# Patient Record
Sex: Male | Born: 1970 | Race: White | Hispanic: No | Marital: Married | Smoking: Former smoker
Health system: Southern US, Community
[De-identification: ages and names within clinical notes are randomized; demographics above are authoritative.]

## PROBLEM LIST (undated history)

## (undated) DIAGNOSIS — N200 Calculus of kidney: Secondary | ICD-10-CM

## (undated) DIAGNOSIS — J309 Allergic rhinitis, unspecified: Secondary | ICD-10-CM

## (undated) DIAGNOSIS — L409 Psoriasis, unspecified: Secondary | ICD-10-CM

## (undated) DIAGNOSIS — Z86010 Personal history of colonic polyps: Secondary | ICD-10-CM

## (undated) DIAGNOSIS — K219 Gastro-esophageal reflux disease without esophagitis: Secondary | ICD-10-CM

## (undated) DIAGNOSIS — E785 Hyperlipidemia, unspecified: Secondary | ICD-10-CM

## (undated) DIAGNOSIS — R3915 Urgency of urination: Secondary | ICD-10-CM

## (undated) HISTORY — DX: Gastro-esophageal reflux disease without esophagitis: K21.9

## (undated) HISTORY — DX: Calculus of kidney: N20.0

## (undated) HISTORY — DX: Hyperlipidemia, unspecified: E78.5

## (undated) HISTORY — DX: Personal history of colonic polyps: Z86.010

## (undated) HISTORY — PX: COLONOSCOPY: SHX174

## (undated) HISTORY — DX: Allergic rhinitis, unspecified: J30.9

## (undated) HISTORY — PX: KIDNEY STONE SURGERY: SHX686

## (undated) HISTORY — DX: Psoriasis, unspecified: L40.9

## (undated) HISTORY — DX: Urgency of urination: R39.15

---

## 1998-04-28 ENCOUNTER — Encounter: Payer: Self-pay | Admitting: Emergency Medicine

## 1998-04-28 ENCOUNTER — Emergency Department (HOSPITAL_COMMUNITY): Admission: EM | Admit: 1998-04-28 | Discharge: 1998-04-28 | Payer: Self-pay | Admitting: Emergency Medicine

## 1999-06-08 ENCOUNTER — Emergency Department (HOSPITAL_COMMUNITY): Admission: EM | Admit: 1999-06-08 | Discharge: 1999-06-08 | Payer: Self-pay | Admitting: Emergency Medicine

## 1999-06-08 ENCOUNTER — Encounter: Payer: Self-pay | Admitting: Emergency Medicine

## 2006-12-13 ENCOUNTER — Ambulatory Visit: Payer: Self-pay | Admitting: Family Medicine

## 2006-12-28 ENCOUNTER — Ambulatory Visit: Payer: Self-pay | Admitting: Family Medicine

## 2010-01-08 ENCOUNTER — Ambulatory Visit: Payer: Self-pay | Admitting: Family Medicine

## 2010-01-13 ENCOUNTER — Ambulatory Visit: Payer: Self-pay | Admitting: Family Medicine

## 2010-04-15 ENCOUNTER — Ambulatory Visit: Payer: Self-pay | Admitting: Family Medicine

## 2010-06-09 LAB — HM COLONOSCOPY

## 2010-07-07 ENCOUNTER — Ambulatory Visit: Payer: Self-pay | Admitting: Family Medicine

## 2011-06-24 LAB — HM COLONOSCOPY

## 2011-07-14 ENCOUNTER — Other Ambulatory Visit: Payer: Self-pay | Admitting: Family Medicine

## 2011-07-17 ENCOUNTER — Other Ambulatory Visit: Payer: Self-pay | Admitting: Family Medicine

## 2011-07-18 ENCOUNTER — Other Ambulatory Visit: Payer: Self-pay | Admitting: Family Medicine

## 2011-08-25 ENCOUNTER — Other Ambulatory Visit: Payer: Self-pay | Admitting: Family Medicine

## 2011-08-26 NOTE — Telephone Encounter (Signed)
Patient needs a Office visit to check his cholestrol.

## 2011-09-12 ENCOUNTER — Other Ambulatory Visit: Payer: Self-pay | Admitting: Family Medicine

## 2011-10-10 ENCOUNTER — Telehealth: Payer: Self-pay | Admitting: Family Medicine

## 2011-10-10 MED ORDER — PRAVASTATIN SODIUM 40 MG PO TABS: 40.0000 mg | ORAL_TABLET | Freq: Every day | ORAL | Status: DC

## 2011-10-10 NOTE — Telephone Encounter (Signed)
Med sent in.

## 2011-10-10 NOTE — Telephone Encounter (Signed)
Med ordered

## 2011-10-17 ENCOUNTER — Encounter: Payer: Self-pay | Admitting: Family Medicine

## 2011-10-17 ENCOUNTER — Ambulatory Visit (INDEPENDENT_AMBULATORY_CARE_PROVIDER_SITE_OTHER): Payer: BC Managed Care – PPO | Admitting: Family Medicine

## 2011-10-17 VITALS — BP 100/70 | HR 56 | Ht 62.0 in | Wt 138.0 lb

## 2011-10-17 DIAGNOSIS — Z79899 Other long term (current) drug therapy: Secondary | ICD-10-CM

## 2011-10-17 DIAGNOSIS — L409 Psoriasis, unspecified: Secondary | ICD-10-CM | POA: Insufficient documentation

## 2011-10-17 DIAGNOSIS — L408 Other psoriasis: Secondary | ICD-10-CM

## 2011-10-17 DIAGNOSIS — J309 Allergic rhinitis, unspecified: Secondary | ICD-10-CM | POA: Insufficient documentation

## 2011-10-17 DIAGNOSIS — Z8719 Personal history of other diseases of the digestive system: Secondary | ICD-10-CM

## 2011-10-17 DIAGNOSIS — E785 Hyperlipidemia, unspecified: Secondary | ICD-10-CM | POA: Insufficient documentation

## 2011-10-17 LAB — COMPREHENSIVE METABOLIC PANEL
ALT: 9 U/L (ref 0–53)
AST: 18 U/L (ref 0–37)
Albumin: 4.6 g/dL (ref 3.5–5.2)
Alkaline Phosphatase: 51 U/L (ref 39–117)
BUN: 9 mg/dL (ref 6–23)
CO2: 27 mEq/L (ref 19–32)
Calcium: 9.2 mg/dL (ref 8.4–10.5)
Chloride: 105 mEq/L (ref 96–112)
Creat: 0.67 mg/dL (ref 0.50–1.35)
Glucose, Bld: 78 mg/dL (ref 70–99)
Potassium: 4.1 mEq/L (ref 3.5–5.3)
Sodium: 139 mEq/L (ref 135–145)
Total Bilirubin: 0.5 mg/dL (ref 0.3–1.2)
Total Protein: 6.8 g/dL (ref 6.0–8.3)

## 2011-10-17 LAB — LIPID PANEL
Cholesterol: 211 mg/dL — ABNORMAL HIGH (ref 0–200)
HDL: 39 mg/dL — ABNORMAL LOW (ref >39)
LDL Cholesterol: 157 mg/dL — ABNORMAL HIGH (ref 0–99)
Total CHOL/HDL Ratio: 5.4 Ratio
Triglycerides: 73 mg/dL (ref <150)
VLDL: 15 mg/dL (ref 0–40)

## 2011-10-17 MED ORDER — TRIAMCINOLONE ACETONIDE 0.1 % EX CREA: TOPICAL_CREAM | Freq: Two times a day (BID) | CUTANEOUS | Status: DC

## 2011-10-17 MED ORDER — PRAVASTATIN SODIUM 40 MG PO TABS: 40.0000 mg | ORAL_TABLET | Freq: Every day | ORAL | Status: DC

## 2011-10-17 NOTE — Patient Instructions (Signed)
Use the cream twice per day for several weeks and then back off to once per day. If this works she may go to every other day or even every third day. If there is no improvement, I will refer you to dermatology. Followup with Dr. Noe Gens concerning your gastritis

## 2011-10-17 NOTE — Progress Notes (Signed)
  Subjective:    Patient ID: Javier Cantrell, male    DOB: 1970-08-24, 41 y.o.   MRN: [id number]  HPI He is here for a recheck on his lipids. He has not had this checked in well over a year. He is having no difficulty with the medications. He does have a history of gastritis and has been on a PPI. He is essentially symptom-free. His allergies seem to be under good control. He does have difficulty with psoriasis and has been using over-the-counter steroid preparations as well as loose tar ointment. He states that the psoriasis is slowly getting worse over the last several years and harder to control.   Review of Systems     Objective:   Physical Exam Alert and in no distress. Erythematous lesions with central clearing noted on both elbows. Cardiac exam shows regular rhythm without murmurs or gallops. Lungs are clear to auscultation.       Assessment & Plan:   1. Psoriasis    2. History of gastritis    3. Hyperlipidemia LDL goal < 100  Comprehensive metabolic panel, Lipid panel, pravastatin (PRAVACHOL) 40 MG tablet  4. Allergic rhinitis, mild    5. Encounter for long-term (current) use of other medications  Comprehensive metabolic panel, Lipid panel   he will followup with his gastroenterologist concerning his gastritis which was diagnosed on endoscopy. Recommend he use triamcinolone regularly to get his psoriasis under control and then back off potential using OTC steroid preparations. If this does not work, I will refer to dermatology. Continue on his allergy medications.

## 2011-11-07 ENCOUNTER — Telehealth: Payer: Self-pay | Admitting: Internal Medicine

## 2011-11-07 ENCOUNTER — Other Ambulatory Visit: Payer: Self-pay | Admitting: Family Medicine

## 2011-11-07 NOTE — Telephone Encounter (Signed)
Discontinued the pravastatin that was eprescribed today and called and scheduled it as well,. There was already one done on 3/25 #30 with 11 refills

## 2011-11-07 NOTE — Telephone Encounter (Signed)
Called and cancelled this RX. Another rx was already done

## 2011-11-14 ENCOUNTER — Telehealth: Payer: Self-pay | Admitting: Internal Medicine

## 2011-11-14 MED ORDER — TRIAMCINOLONE ACETONIDE 0.1 % EX CREA: TOPICAL_CREAM | Freq: Two times a day (BID) | CUTANEOUS | Status: AC

## 2011-11-14 NOTE — Telephone Encounter (Signed)
Call him in a larger size tube

## 2011-11-14 NOTE — Telephone Encounter (Signed)
pt wants to know if he can get kenalog 0/1% in a bigger tube because he is running out every 5-7 days using it 2 times daily. pt states that the tubes are small and when he runs out the pharmacy will not fill it for another 3-4 days. [facility] [address]

## 2011-11-14 NOTE — Telephone Encounter (Signed)
Sent in for jar

## 2011-12-12 ENCOUNTER — Other Ambulatory Visit: Payer: Self-pay

## 2011-12-12 DIAGNOSIS — E785 Hyperlipidemia, unspecified: Secondary | ICD-10-CM

## 2011-12-12 MED ORDER — PRAVASTATIN SODIUM 40 MG PO TABS: 40.0000 mg | ORAL_TABLET | Freq: Every day | ORAL | Status: DC

## 2011-12-12 NOTE — Telephone Encounter (Signed)
Fax came in and pt needs 90 day supply sent in

## 2012-02-13 ENCOUNTER — Other Ambulatory Visit: Payer: Self-pay | Admitting: Family Medicine

## 2013-03-10 ENCOUNTER — Other Ambulatory Visit: Payer: Self-pay | Admitting: Family Medicine

## 2013-04-09 ENCOUNTER — Other Ambulatory Visit: Payer: Self-pay | Admitting: Family Medicine

## 2013-06-08 ENCOUNTER — Other Ambulatory Visit: Payer: Self-pay | Admitting: Family Medicine

## 2013-06-17 ENCOUNTER — Other Ambulatory Visit: Payer: Self-pay | Admitting: Family Medicine

## 2013-06-27 ENCOUNTER — Other Ambulatory Visit: Payer: Self-pay

## 2013-06-27 ENCOUNTER — Telehealth: Payer: Self-pay | Admitting: Family Medicine

## 2013-06-27 MED ORDER — PRAVASTATIN SODIUM 40 MG PO TABS: ORAL_TABLET | ORAL | Status: DC

## 2013-06-27 NOTE — Telephone Encounter (Signed)
SENT MED IN 

## 2013-06-27 NOTE — Telephone Encounter (Signed)
DONE

## 2013-06-27 NOTE — Telephone Encounter (Signed)
Pt scheduled a med check for next week but is completely out of his medication Pravachol send to [address]

## 2013-07-04 ENCOUNTER — Encounter: Payer: Self-pay | Admitting: Family Medicine

## 2013-07-04 ENCOUNTER — Ambulatory Visit (INDEPENDENT_AMBULATORY_CARE_PROVIDER_SITE_OTHER): Payer: BC Managed Care – PPO | Admitting: Family Medicine

## 2013-07-04 VITALS — BP 126/90 | HR 56 | Wt 138.0 lb

## 2013-07-04 DIAGNOSIS — L409 Psoriasis, unspecified: Secondary | ICD-10-CM

## 2013-07-04 DIAGNOSIS — L408 Other psoriasis: Secondary | ICD-10-CM

## 2013-07-04 DIAGNOSIS — E785 Hyperlipidemia, unspecified: Secondary | ICD-10-CM

## 2013-07-04 DIAGNOSIS — J309 Allergic rhinitis, unspecified: Secondary | ICD-10-CM

## 2013-07-04 LAB — LIPID PANEL
Cholesterol: 230 mg/dL — ABNORMAL HIGH (ref 0–200)
Total CHOL/HDL Ratio: 4.8 Ratio
Triglycerides: 63 mg/dL (ref <150)

## 2013-07-04 NOTE — Progress Notes (Signed)
Teaching Physician: Sharlot Gowda, MD Dictated By: Judithann Graves  Subjective:  Javier Cantrell is a 42 y.o. male who presents for an interval well visit. He has no particular issues that he would like to address today.  He is taking Pravastatin for management of his hyperlipidemia and tolerating it well with no myalgias. Has a remote history of GI spasm for which he takes Hyoscyamine as needed. He reports no dry mouth, blurry vision, rapid heart rate when he takes this medication. Has history of psoriasis and he relates that this is under good control - he uses Psoriasen cream when he has a flare. He has no joint pain or swelling. He takes Cetirizine as needed for control of his allergic rhinitis and lately has had no problems with congestion or rhinorrhea.  Family and social histories were reviewed and unchanged.   ROS negative except as in subjective.  Objective: Filed Vitals:   07/04/13 0824  BP: 126/90  Pulse: 56    Physical Exam:  General: Alert and in no distress  CV: Regular sinus rhythm without murmurs or gallops  Pulm: Lungs are clear to auscultation  Skin: 1-2 cm erythematous plaque over extensor surface R elbow with overlying thick scale. Ext: Pitting of fingernails present  Assessment and Plan: 1. Psoriasis Self-managing to good effect with use of Psoriasen as needed.  2. Hyperlipidemia LDL goal < 100 Continue current therapy with Pravastatin. - Check Lipid panel today  3. Allergic rhinitis, mild Under good control with prn use of Cetirizine.   Dr. Susann Givens was present for the encounter and agrees with the above assessment and plan.

## 2013-07-05 ENCOUNTER — Other Ambulatory Visit: Payer: Self-pay

## 2013-07-05 MED ORDER — ATORVASTATIN CALCIUM 20 MG PO TABS: 20.0000 mg | ORAL_TABLET | Freq: Every day | ORAL | Status: DC

## 2013-07-05 NOTE — Telephone Encounter (Signed)
Sent in lipitor per [employer]

## 2014-08-17 ENCOUNTER — Other Ambulatory Visit: Payer: Self-pay | Admitting: Family Medicine

## 2014-10-06 ENCOUNTER — Ambulatory Visit (INDEPENDENT_AMBULATORY_CARE_PROVIDER_SITE_OTHER): Payer: BLUE CROSS/BLUE SHIELD | Admitting: Family Medicine

## 2014-10-06 ENCOUNTER — Encounter: Payer: Self-pay | Admitting: Family Medicine

## 2014-10-06 VITALS — BP 130/80 | HR 62 | Wt 135.0 lb

## 2014-10-06 DIAGNOSIS — E785 Hyperlipidemia, unspecified: Secondary | ICD-10-CM | POA: Diagnosis not present

## 2014-10-06 DIAGNOSIS — Z79899 Other long term (current) drug therapy: Secondary | ICD-10-CM | POA: Diagnosis not present

## 2014-10-06 DIAGNOSIS — J309 Allergic rhinitis, unspecified: Secondary | ICD-10-CM

## 2014-10-06 DIAGNOSIS — L409 Psoriasis, unspecified: Secondary | ICD-10-CM

## 2014-10-06 LAB — COMPREHENSIVE METABOLIC PANEL
ALBUMIN: 4.7 g/dL (ref 3.5–5.2)
ALK PHOS: 71 U/L (ref 39–117)
ALT: 23 U/L (ref 0–53)
AST: 37 U/L (ref 0–37)
BILIRUBIN TOTAL: 0.6 mg/dL (ref 0.2–1.2)
BUN: 8 mg/dL (ref 6–23)
CO2: 23 meq/L (ref 19–32)
CREATININE: 0.76 mg/dL (ref 0.50–1.35)
Calcium: 9.4 mg/dL (ref 8.4–10.5)
Chloride: 104 mEq/L (ref 96–112)
GLUCOSE: 90 mg/dL (ref 70–99)
POTASSIUM: 4.4 meq/L (ref 3.5–5.3)
Sodium: 138 mEq/L (ref 135–145)
Total Protein: 6.9 g/dL (ref 6.0–8.3)

## 2014-10-06 LAB — LIPID PANEL
Cholesterol: 187 mg/dL (ref 0–200)
HDL: 50 mg/dL (ref >=40)
LDL Cholesterol: 110 mg/dL — ABNORMAL HIGH (ref 0–99)
Total CHOL/HDL Ratio: 3.7 Ratio
Triglycerides: 136 mg/dL (ref <150)
VLDL: 27 mg/dL (ref 0–40)

## 2014-10-06 LAB — CBC WITH DIFFERENTIAL/PLATELET
Basophils Absolute: 0 10*3/uL (ref 0.0–0.1)
Basophils Relative: 0 % (ref 0–1)
EOS ABS: 0.2 10*3/uL (ref 0.0–0.7)
EOS PCT: 3 % (ref 0–5)
HEMATOCRIT: 45.5 % (ref 39.0–52.0)
HEMOGLOBIN: 15.4 g/dL (ref 13.0–17.0)
Lymphocytes Relative: 28 % (ref 12–46)
Lymphs Abs: 2 10*3/uL (ref 0.7–4.0)
MCH: 30 pg (ref 26.0–34.0)
MCHC: 33.8 g/dL (ref 30.0–36.0)
MCV: 88.5 fL (ref 78.0–100.0)
MONOS PCT: 8 % (ref 3–12)
MPV: 9.4 fL (ref 8.6–12.4)
Monocytes Absolute: 0.6 10*3/uL (ref 0.1–1.0)
NEUTROS ABS: 4.3 10*3/uL (ref 1.7–7.7)
Neutrophils Relative %: 61 % (ref 43–77)
Platelets: 274 10*3/uL (ref 150–400)
RBC: 5.14 MIL/uL (ref 4.22–5.81)
RDW: 13.1 % (ref 11.5–15.5)
WBC: 7 10*3/uL (ref 4.0–10.5)

## 2014-10-06 NOTE — Progress Notes (Signed)
   Subjective:    Patient ID: Javier Cantrell, male    DOB: 06/03/1971, 44 y.o.   MRN: [id number]  HPI He is here for medication management. He does have underlying psoriasis and is using topical medications on this. He is having no difficulty with them. His allergies are under good control. He also continues on Lipitor and is having no difficulties with that. He does have an 46 year old daughter who has recently cause some difficulty with the auto accident. He is however making her partially pay for the damages. He also has a history of colonic polyps.  Review of Systems  All other systems reviewed and are negative.      Objective:   Physical Exam Alert and in no distress. Tympanic membranes and canals are normal. Pharyngeal area is normal. Neck is supple without adenopathy or thyromegaly. Cardiac exam shows a regular sinus rhythm without murmurs or gallops. Lungs are clear to auscultation.        Assessment & Plan:  Psoriasis - Plan: CBC with Differential/Platelet, Comprehensive metabolic panel  Hyperlipidemia with target LDL less than 100 - Plan: Lipid panel  Allergic rhinitis, mild  Encounter for long-term (current) use of medications - Plan: CBC with Differential/Platelet, Comprehensive metabolic panel, Lipid panel Encouraged him to set up for complete exam. I will need to get more information concerning the colonic polyps because apparently they were hyperplastic

## 2014-10-09 ENCOUNTER — Encounter: Payer: Self-pay | Admitting: Family Medicine

## 2014-10-10 ENCOUNTER — Other Ambulatory Visit: Payer: Self-pay | Admitting: Family Medicine

## 2015-01-13 ENCOUNTER — Encounter: Payer: Self-pay | Admitting: Family Medicine

## 2015-01-13 ENCOUNTER — Ambulatory Visit (INDEPENDENT_AMBULATORY_CARE_PROVIDER_SITE_OTHER): Payer: BLUE CROSS/BLUE SHIELD | Admitting: Family Medicine

## 2015-01-13 VITALS — BP 110/70 | HR 72 | Wt 134.0 lb

## 2015-01-13 DIAGNOSIS — S39012A Strain of muscle, fascia and tendon of lower back, initial encounter: Secondary | ICD-10-CM

## 2015-01-13 MED ORDER — CARISOPRODOL 350 MG PO TABS: 350.0000 mg | ORAL_TABLET | Freq: Four times a day (QID) | ORAL | Status: DC | PRN

## 2015-01-13 NOTE — Patient Instructions (Addendum)
Heat to your back for 20 minutes 3 times per day.Do stretching afterwards. 4 ibuprofen 3 times per day.Use a muscle relaxer at night. Proper posturing. Sit like your mama taught you!

## 2015-01-13 NOTE — Progress Notes (Signed)
   Subjective:    Patient ID: Javier Cantrell, male    DOB: September 30, 1970, 44 y.o.   MRN: [id number]  HPI  He injured his back at work one month ago. He describes a twisting motion to the right lost trying to stop a heavy object from moving her and he developed some right mid back pain. He did continue to work. He did try Aleve 2 pills twice per day without much success.   Review of Systems     Objective:   Physical Exam Good motion of the back. Palpable tenderness in the right paravertebral muscles in the lower thoracic area.       Assessment & Plan:  Back strain, initial encounter - Plan: carisoprodol (SOMA) 350 MG tablet Recommend heat and stretching as well as proper posturing. Recommend 800 mg 3 times a day of ibuprofen and use soma at night. He will call if continued difficulty. I also discussed massage and possible referral to physical therapy.

## 2015-01-17 ENCOUNTER — Other Ambulatory Visit: Payer: Self-pay | Admitting: Family Medicine

## 2015-01-19 NOTE — Telephone Encounter (Signed)
Is this okay?

## 2015-11-01 ENCOUNTER — Encounter (HOSPITAL_COMMUNITY): Payer: Self-pay | Admitting: Emergency Medicine

## 2015-11-01 ENCOUNTER — Ambulatory Visit (HOSPITAL_COMMUNITY)
Admission: EM | Admit: 2015-11-01 | Discharge: 2015-11-01 | Disposition: A | Discharge status: Home / Self Care | Payer: BLUE CROSS/BLUE SHIELD | Attending: Family Medicine | Admitting: Family Medicine

## 2015-11-01 DIAGNOSIS — S41152A Open bite of left upper arm, initial encounter: Secondary | ICD-10-CM | POA: Diagnosis not present

## 2015-11-01 DIAGNOSIS — S61452A Open bite of left hand, initial encounter: Secondary | ICD-10-CM | POA: Diagnosis not present

## 2015-11-01 DIAGNOSIS — W540XXA Bitten by dog, initial encounter: Secondary | ICD-10-CM

## 2015-11-01 MED ORDER — CEFTRIAXONE SODIUM 1 G IJ SOLR
1.0000 g | Freq: Once | INTRAMUSCULAR | Status: AC
  Administered 2015-11-01: 1 g via INTRAMUSCULAR

## 2015-11-01 MED ORDER — LIDOCAINE HCL (PF) 1 % IJ SOLN
INTRAMUSCULAR | Status: AC
  Filled 2015-11-01: qty 5

## 2015-11-01 MED ORDER — TETANUS-DIPHTH-ACELL PERTUSSIS 5-2.5-18.5 LF-MCG/0.5 IM SUSP
INTRAMUSCULAR | Status: AC
  Filled 2015-11-01: qty 0.5

## 2015-11-01 MED ORDER — TRAMADOL HCL 50 MG PO TABS: 50.0000 mg | ORAL_TABLET | Freq: Four times a day (QID) | ORAL | Status: DC | PRN

## 2015-11-01 MED ORDER — CEFTRIAXONE SODIUM 1 G IJ SOLR
INTRAMUSCULAR | Status: AC
  Filled 2015-11-01: qty 10

## 2015-11-01 MED ORDER — AMOXICILLIN-POT CLAVULANATE 875-125 MG PO TABS: 1.0000 | ORAL_TABLET | Freq: Two times a day (BID) | ORAL | Status: DC

## 2015-11-01 MED ORDER — TETANUS-DIPHTH-ACELL PERTUSSIS 5-2.5-18.5 LF-MCG/0.5 IM SUSP
0.5000 mL | Freq: Once | INTRAMUSCULAR | Status: AC
  Administered 2015-11-01: 0.5 mL via INTRAMUSCULAR

## 2015-11-01 NOTE — ED Provider Notes (Signed)
CSN: [id number]     Arrival date & time 11/01/15  1308 History   First MD Initiated Contact with Patient 11/01/15 1409     Chief Complaint  Patient presents with  . Animal Bite   (Consider location/radiation/quality/duration/timing/severity/associated sxs/prior Treatment) HPI Comments: 45 year old male was attempting to break up a dogfight as described in medical assistance notes. He received multiple abrasions and puncture wounds to the left forearm and hand. He denies injuries to other areas. There are tooth abrasions to the back of the hand. Patient states that his story is up-to-date with rabies and other vaccinations. And he believes his tetanus shot was between 5 and 10 years ago. Patient states he irrigated his arm with bottles of hydrogen peroxide.   History reviewed. No pertinent past medical history. History reviewed. No pertinent past surgical history. No family history on file. Social History  Substance Use Topics  . Smoking status: Never Smoker   . Smokeless tobacco: None  . Alcohol Use: No    Review of Systems  Constitutional: Negative.   Respiratory: Negative.   Cardiovascular: Negative.   Musculoskeletal: Negative.   Skin: Positive for wound. Negative for color change and rash.  Neurological: Negative.   All other systems reviewed and are negative.   Allergies  Review of patient's allergies indicates no known allergies.  Home Medications   Prior to Admission medications   Medication Sig Start Date End Date Taking? Authorizing Provider  cetirizine (ZYRTEC) 10 MG tablet Take 10 mg by mouth daily. PRN as needed   Yes Historical Provider, MD  amoxicillin-clavulanate (AUGMENTIN) 875-125 MG tablet Take 1 tablet by mouth every 12 (twelve) hours. 11/01/15   Hayden Rasmussenavid Shuntay Everetts, NP  atorvastatin (LIPITOR) 20 MG tablet TAKE 1 TABLET (20 MG TOTAL) BY MOUTH DAILY. 10/13/14   Ronnald NianJohn C Lalonde, MD  carisoprodol (SOMA) 350 MG tablet TAKE 1 TABLET FOUR TIMES A DAY AS NEEDED FOR SPASMS  01/19/15   Ronnald NianJohn C Lalonde, MD  traMADol (ULTRAM) 50 MG tablet Take 1 tablet (50 mg total) by mouth every 6 (six) hours as needed. 11/01/15   Hayden Rasmussenavid Vayla Wilhelmi, NP  triamcinolone cream (KENALOG) 0.1 % APPLY TOPICALLY 2 (TWO) TIMES DAILY. 10/13/14   Ronnald NianJohn C Lalonde, MD   Meds Ordered and Administered this Visit   Medications  cefTRIAXone (ROCEPHIN) injection 1 g (1 g Intramuscular Given 11/01/15 1506)  Tdap (BOOSTRIX) injection 0.5 mL (0.5 mLs Intramuscular Given 11/01/15 1506)    BP 135/88 mmHg  Pulse 71  Temp(Src) 99.7 F (37.6 C) (Oral)  Resp 18  SpO2 100% No data found.   Physical Exam  Constitutional: He is oriented to person, place, and time. He appears well-developed and well-nourished. No distress.  Eyes: EOM are normal.  Neck: Normal range of motion. Neck supple.  Cardiovascular: Normal rate.   Pulmonary/Chest: Effort normal. No respiratory distress.  Musculoskeletal: He exhibits no edema.  Function of the left arm and hand is intact. This 4 range of motion of all digits, wrist and forearm and elbow. Distal neurovascular and motor sensory is grossly intact. Radial pulse 2+.  Neurological: He is alert and oriented to person, place, and time. He exhibits normal muscle tone.  Skin: Skin is warm and dry.  Wounds to the left hand and forearm include approximately 7 puncture wounds. 5 puncture wounds to the left forearm 1 to the palm of the hand near the thenar eminence and a more shallow wound to the dorsum of the hand. There are abrasions to the back of  the hand from tooth marks. See photographs below.  Psychiatric: He has a normal mood and affect.  Nursing note and vitals reviewed.   ED Course  Procedures (including critical care time)  Labs Review Labs Reviewed - No data to display  Imaging Review No results found.         Visual Acuity Review  Right Eye Distance:   Left Eye Distance:   Bilateral Distance:    Right Eye Near:   Left Eye Near:    Bilateral Near:          MDM   1. Dog bite of arm, left, initial encounter   2. Dog bite, hand, left, initial encounter    Meds ordered this encounter  Medications  . cefTRIAXone (ROCEPHIN) injection 1 g    Sig:   . Tdap (BOOSTRIX) injection 0.5 mL    Sig:   . amoxicillin-clavulanate (AUGMENTIN) 875-125 MG tablet    Sig: Take 1 tablet by mouth every 12 (twelve) hours.    Dispense:  14 tablet    Refill:  0    Order Specific Question:  Supervising Provider    Answer:  Linna Hoff [phone]  . traMADol (ULTRAM) 50 MG tablet    Sig: Take 1 tablet (50 mg total) by mouth every 6 (six) hours as needed.    Dispense:  15 tablet    Refill:  0    Order Specific Question:  Supervising Provider    Answer:  Linna Hoff [phone]   The arm was scrubbed with Betadine and then irrigated with normal saline. Bacitracin ointment and sterile dressing applied. The patient is instructed to change dressing twice a day and use Hibiclens. Follow-up in 2 days for wound check. If there is worsening, redness, swelling, drainage, pus or other problems go directly to the emergency department.    Hayden Rasmussen, NP 11/01/15 [phone]

## 2015-11-01 NOTE — Discharge Instructions (Signed)
Animal Bite Change dressing twice a day for the first 2-3 days. Patient to wash the entire arm and hand with Hibiclens which she may obtain at the drugstore. Then irrigate the arm and hand with water or saline. For any signs of infection, increased swelling, pus, redness or other signs of infection such as that written below go directly to the emergency department. Otherwise he will need a wound check in 2 days. He may follow up with your primary care doctor or you may return to the urgent care. Take your antibiotics as directed. Animal bites can range from mild to serious. An animal bite can result in a scratch on the skin, a deep open cut, a puncture of the skin, a crush injury, or tearing away of the skin or a body part. A small bite from a house pet will usually not cause serious problems. However, some animal bites can become infected or injure a bone or other tissue.  Bites from certain animals can be more dangerous because of the risk of spreading rabies, which is a serious viral infection. This risk is higher with bites from stray animals or wild animals, such as raccoons, foxes, skunks, and bats. Dogs are responsible for most animal bites. Children are bitten more often than adults. SYMPTOMS  Common symptoms of an animal bite include:   Pain.   Bleeding.   Swelling.   Bruising.  DIAGNOSIS  This condition may be diagnosed based on a physical exam and medical history. Your health care provider will examine the wound and ask for details about the animal and how the bite happened. You may also have tests, such as:   Blood tests to check for infection or to determine if surgery is needed.  X-rays to check for damage to bones or joints.  Culture test. This uses a sample of fluid from the wound to check for infection. TREATMENT  Treatment varies depending on the location and type of animal bite and your medical history. Treatment may include:   Wound care. This often includes  cleaning the wound, flushing the wound with saline solution, and applying a bandage (dressing). Sometimes, the wound is left open to heal because of the high risk of infection. However, in some cases, the wound may be closed with stitches (sutures), staples, skin glue, or adhesive strips.   Antibiotic medicine.   Tetanus shot.   Rabies treatment if the animal could have rabies.  In some cases, bites that have become infected may require IV antibiotics and surgical treatment in the hospital.  HOME CARE INSTRUCTIONS Wound Care  Follow instructions from your health care provider about how to take care of your wound. Make sure you:  Wash your hands with soap and water before you change your dressing. If soap and water are not available, use hand sanitizer.  Change your dressing as told by your health care provider.  Leave sutures, skin glue, or adhesive strips in place. These skin closures may need to be in place for 2 weeks or longer. If adhesive strip edges start to loosen and curl up, you may trim the loose edges. Do not remove adhesive strips completely unless your health care provider tells you to do that.  Check your wound every day for signs of infection. Watch for:   Increasing redness, swelling, or pain.   Fluid, blood, or pus.  General Instructions  Take or apply over-the-counter and prescription medicines only as told by your health care provider.   If you were  prescribed an antibiotic, take or apply it as told by your health care provider. Do not stop using the antibiotic even if your condition improves.   Keep the injured area raised (elevated) above the level of your heart while you are sitting or lying down, if this is possible.   If directed, apply ice to the injured area.   Put ice in a plastic bag.   Place a towel between your skin and the bag.   Leave the ice on for 20 minutes, 2-3 times per day.   Keep all follow-up visits as told by your  health care provider. This is important.  SEEK MEDICAL CARE IF:  You have increasing redness, swelling, or pain at the site of your wound.   You have a general feeling of sickness (malaise).   You feel nauseous or you vomit.   You have pain that does not get better.  SEEK IMMEDIATE MEDICAL CARE IF:  You have a red streak extending away from your wound.   You have fluid, blood, or pus coming from your wound.   You have a fever or chills.   You have trouble moving your injured area.   You have numbness or tingling extending beyond the wound.   This information is not intended to replace advice given to you by your health care provider. Make sure you discuss any questions you have with your health care provider.   Document Released: 03/29/2011 Document Revised: 04/01/2015 Document Reviewed: 11/26/2014 Elsevier Interactive Patient Education 2016 [employer]   Wound Check 2 days If you have a wound, it may take some time to heal. Eventually, a scar will form. The scar will also fade with time. It is important to take care of your wound while it is healing. This helps to protect your wound from infection.  HOW SHOULD I TAKE CARE OF MY WOUND AT HOME?  Some wounds are allowed to close on their own or are repaired at a later date. There are many different ways to close and cover a wound, including stitches (sutures), skin glue, and adhesive strips. Follow your health care provider's instructions about:  Wound care.  Bandage (dressing) changes and removal.  Wound closure removal.  Take medicines only as directed by your health care provider.  Keep all follow-up visits as directed by your health care provider. This is important.  Do not take baths, swim, or use a hot tub until your health care provider approves. You may shower as directed by your health care provider.  Keep your wound clean and dry. WHAT AFFECTS SCAR FORMATION? Scars affect each person differently.  How your body scars depends on:  The location and size of your wound.  Traits that you inherited from your parents (genetic predisposition).  How you take care of your wound. Irritation and inflammation increase the amount of scar formation.  Sun exposure. This can darken a scar. WHEN SHOULD I CALL OR SEE MY HEALTH CARE PROVIDER? Call or see your health care provider if:  You have redness, swelling, or pain at your wound site.  You have fluid, blood, or pus coming from your wound.  You have muscle aches, chills, or a general ill feeling.  You notice a bad smell coming from the wound.  Your wound separates after the sutures, staples, or skin adhesive strips have been removed.  You have persistent nausea or vomiting.  You have a fever.  You are dizzy. WHEN SHOULD I CALL 911 OR GO TO THE  EMERGENCY ROOM? Call 911 or go to the emergency room if:  You faint.  You have difficulty breathing.   This information is not intended to replace advice given to you by your health care provider. Make sure you discuss any questions you have with your health care provider.   Document Released: 04/16/2004 Document Revised: 08/01/2014 Document Reviewed: 04/22/2014 Elsevier Interactive Patient Education [employer]

## 2015-11-01 NOTE — ED Notes (Signed)
Pt reports his german shepard bit him on his left arm onset 0730 today Reports he placed a new dog w/his german shepard and the [place] attacked the other dog He tried to break it off.... Has mult bite marks on left arm and inside left palm. States dog is UTD w/vaccianation  Unsure of last tetanus A&O x4... No acute distress.

## 2015-11-01 NOTE — ED Notes (Signed)
Scrubbed w/chlorhexidine scrub; rinsed w/NS; applied bacitracin to bite marks; wrapped in kling and secured w/4 in coban.

## 2015-11-04 ENCOUNTER — Ambulatory Visit (INDEPENDENT_AMBULATORY_CARE_PROVIDER_SITE_OTHER): Payer: BLUE CROSS/BLUE SHIELD | Admitting: Family Medicine

## 2015-11-04 ENCOUNTER — Encounter: Payer: Self-pay | Admitting: Family Medicine

## 2015-11-04 VITALS — BP 120/78 | HR 66 | Ht 63.0 in | Wt 140.4 lb

## 2015-11-04 DIAGNOSIS — W540XXA Bitten by dog, initial encounter: Secondary | ICD-10-CM | POA: Diagnosis not present

## 2015-11-04 DIAGNOSIS — S61452A Open bite of left hand, initial encounter: Secondary | ICD-10-CM

## 2015-11-04 NOTE — Progress Notes (Signed)
   Subjective:    Patient ID: Javier Cantrell, male    DOB: 12/15/1970, 45 y.o.   MRN: [id number]  HPI He sustained a dog bite to his left hand and arm on Sunday. He was seen later that day in an urgent care center and placed on antibiotics. He was also told to use Hibiclens and Neosporin ointment. He is here for recheck. He has no complaints of hand or arm pain. He was also given a tetanus shot.   Review of Systems     Objective:   Physical Exam Exam of the left arm shows multiple healing lesions on the dorsal and ventral surface of the arm. He also has a one and half centimeter open wound on the left hand in the hypo-thenar area. Exam of the fourth and fifth finger shows full motion with no pain. There is no pain in the palm of the hand near the flexors of the fourth and fifth fingers.       Assessment & Plan:  Dog bite of hand without complication, left, initial encounter opinion on the antibiotic which is Augmentin. I explained that at this point he seems to have done well with no evidence of infection. Warned that if his hand pain gets worse, he should call.

## 2015-12-08 ENCOUNTER — Other Ambulatory Visit: Payer: Self-pay | Admitting: Family Medicine

## 2016-03-15 ENCOUNTER — Encounter: Payer: Self-pay | Admitting: Family Medicine

## 2016-03-15 ENCOUNTER — Ambulatory Visit (INDEPENDENT_AMBULATORY_CARE_PROVIDER_SITE_OTHER): Payer: BLUE CROSS/BLUE SHIELD | Admitting: Family Medicine

## 2016-03-15 VITALS — BP 114/80 | HR 54 | Ht 63.0 in | Wt 135.0 lb

## 2016-03-15 DIAGNOSIS — J309 Allergic rhinitis, unspecified: Secondary | ICD-10-CM | POA: Diagnosis not present

## 2016-03-15 DIAGNOSIS — E785 Hyperlipidemia, unspecified: Secondary | ICD-10-CM

## 2016-03-15 DIAGNOSIS — L409 Psoriasis, unspecified: Secondary | ICD-10-CM | POA: Diagnosis not present

## 2016-03-15 DIAGNOSIS — M79632 Pain in left forearm: Secondary | ICD-10-CM | POA: Diagnosis not present

## 2016-03-15 LAB — COMPREHENSIVE METABOLIC PANEL
ALT: 32 U/L (ref 9–46)
AST: 31 U/L (ref 10–40)
Albumin: 4.3 g/dL (ref 3.6–5.1)
Alkaline Phosphatase: 69 U/L (ref 40–115)
BUN: 8 mg/dL (ref 7–25)
CHLORIDE: 103 mmol/L (ref 98–110)
CO2: 24 mmol/L (ref 20–31)
CREATININE: 0.75 mg/dL (ref 0.60–1.35)
Calcium: 9.1 mg/dL (ref 8.6–10.3)
GLUCOSE: 86 mg/dL (ref 65–99)
POTASSIUM: 4 mmol/L (ref 3.5–5.3)
SODIUM: 139 mmol/L (ref 135–146)
TOTAL PROTEIN: 6.8 g/dL (ref 6.1–8.1)
Total Bilirubin: 0.9 mg/dL (ref 0.2–1.2)

## 2016-03-15 LAB — CBC WITH DIFFERENTIAL/PLATELET
BASOS ABS: 72 {cells}/uL (ref 0–200)
Basophils Relative: 1 %
EOS ABS: 288 {cells}/uL (ref 15–500)
EOS PCT: 4 %
HCT: 42.5 % (ref 38.5–50.0)
Hemoglobin: 14.3 g/dL (ref 13.2–17.1)
LYMPHS PCT: 30 %
Lymphs Abs: 2160 cells/uL (ref 850–3900)
MCH: 29.8 pg (ref 27.0–33.0)
MCHC: 33.6 g/dL (ref 32.0–36.0)
MCV: 88.5 fL (ref 80.0–100.0)
MONOS PCT: 6 %
MPV: 9.4 fL (ref 7.5–12.5)
Monocytes Absolute: 432 cells/uL (ref 200–950)
NEUTROS ABS: 4248 {cells}/uL (ref 1500–7800)
Neutrophils Relative %: 59 %
PLATELETS: 232 10*3/uL (ref 140–400)
RBC: 4.8 MIL/uL (ref 4.20–5.80)
RDW: 13.3 % (ref 11.0–15.0)
WBC: 7.2 10*3/uL (ref 4.0–10.5)

## 2016-03-15 LAB — LIPID PANEL
CHOL/HDL RATIO: 2.7 ratio (ref <=5.0)
Cholesterol: 159 mg/dL (ref 125–200)
HDL: 59 mg/dL (ref >=40)
LDL CALC: 80 mg/dL (ref <130)
Triglycerides: 102 mg/dL (ref <150)
VLDL: 20 mg/dL (ref <30)

## 2016-03-15 MED ORDER — TRIAMCINOLONE ACETONIDE 0.1 % EX CREA: TOPICAL_CREAM | CUTANEOUS | 5 refills | Status: DC

## 2016-03-15 MED ORDER — ATORVASTATIN CALCIUM 20 MG PO TABS: ORAL_TABLET | ORAL | 3 refills | Status: DC

## 2016-03-15 NOTE — Progress Notes (Signed)
   Subjective:    Patient ID: Javier Cantrell, male    DOB: 07/22/1971, 45 y.o.   MRN: [id number]  HPI He is here for a med check. Proximally 2 months ago he was bitten by his own dog the left forearm. He now has some slight pain when he leans on his left forearm and does note a bump on the bone of the ulna the midportion. He would also like a refill of his Kenalog for treatment of his underlying psoriasis. He continues on Lipitor for treatment of his hyperlipidemia. His allergies seem to be under good control. Work and home life are going well. Family and social history as well as health maintenance was reviewed.   Review of Systems     Objective:   Physical Exam Alert and in no distress. Tympanic membranes and canals are normal. Pharyngeal area is normal. Neck is supple without adenopathy or thyromegaly. Cardiac exam shows a regular sinus rhythm without murmurs or gallops. Lungs are clear to auscultation. Skin exam does show multiple erythematous plaque-like lesions. Exam of the left forearm does have a slightly tender 1 cm lesion over the midportion of the ulna.       Assessment & Plan:  Psoriasis - Plan: CBC with Differential/Platelet, Comprehensive metabolic panel, triamcinolone cream (KENALOG) 0.1 %  Hyperlipidemia with target LDL less than 100 - Plan: Lipid panel, atorvastatin (LIPITOR) 20 MG tablet  Allergic rhinitis, mild  Left forearm pain He will continue on his present medications. I explained that the forearm lesion is probably a small bruise to the bone to the forearm but nothing needs to be done other than not applying pressure over that area. He was comfortable with that.

## 2016-06-21 ENCOUNTER — Other Ambulatory Visit: Payer: Self-pay | Admitting: Family Medicine

## 2016-06-23 ENCOUNTER — Telehealth: Payer: Self-pay

## 2016-06-23 NOTE — Telephone Encounter (Signed)
Called to see if pt had flu shot pt said no offered appointment to have flu shot pt said no

## 2017-03-23 ENCOUNTER — Other Ambulatory Visit: Payer: Self-pay | Admitting: Family Medicine

## 2017-03-23 DIAGNOSIS — E785 Hyperlipidemia, unspecified: Secondary | ICD-10-CM

## 2017-03-24 NOTE — Telephone Encounter (Signed)
Tried to call pt not able reach him , no voicemail set up

## 2017-03-31 ENCOUNTER — Other Ambulatory Visit: Payer: Self-pay

## 2017-03-31 DIAGNOSIS — E785 Hyperlipidemia, unspecified: Secondary | ICD-10-CM

## 2017-03-31 MED ORDER — ATORVASTATIN CALCIUM 20 MG PO TABS: ORAL_TABLET | ORAL | 0 refills | Status: DC

## 2017-03-31 NOTE — Progress Notes (Signed)
Pt called to schedule appt. 30 day supply of atorvastatin called in. Trixie Rude/RLB

## 2017-04-04 ENCOUNTER — Ambulatory Visit (INDEPENDENT_AMBULATORY_CARE_PROVIDER_SITE_OTHER): Payer: 59 | Admitting: Family Medicine

## 2017-04-04 ENCOUNTER — Encounter: Payer: Self-pay | Admitting: Family Medicine

## 2017-04-04 VITALS — BP 110/70 | HR 54 | Ht 63.0 in | Wt 139.8 lb

## 2017-04-04 DIAGNOSIS — L409 Psoriasis, unspecified: Secondary | ICD-10-CM

## 2017-04-04 DIAGNOSIS — E785 Hyperlipidemia, unspecified: Secondary | ICD-10-CM

## 2017-04-04 DIAGNOSIS — J309 Allergic rhinitis, unspecified: Secondary | ICD-10-CM

## 2017-04-04 DIAGNOSIS — Z79899 Other long term (current) drug therapy: Secondary | ICD-10-CM

## 2017-04-04 MED ORDER — ATORVASTATIN CALCIUM 20 MG PO TABS: ORAL_TABLET | ORAL | 3 refills | Status: DC

## 2017-04-04 NOTE — Progress Notes (Signed)
   Subjective:    Patient ID: Javier Cantrell, male    DOB: 11/30/1970, 46 y.o.   MRN: [id number]  HPI He is here for a medication management visit. He does have underlying hyperlipidemia and continues on Lipitor. He is having no difficulty with that. He also has allergies and is responding nicely to Zyrtec. He has no difficulty from his psoriasis. He has no other concerns or complaints. Work is going well. He does not smoke.   Review of Systems     Objective:   Physical Exam Alert and in no distress. Tympanic membranes and canals are normal. Pharyngeal area is normal. Neck is supple without adenopathy or thyromegaly. Cardiac exam shows a regular sinus rhythm without murmurs or gallops. Lungs are clear to auscultation.        Assessment & Plan:  Hyperlipidemia with target LDL less than 100 - Plan: Lipid panel, atorvastatin (LIPITOR) 20 MG tablet  Psoriasis - Plan: CBC with Differential/Platelet, Comprehensive metabolic panel  Allergic rhinitis, mild - Plan: CBC with Differential/Platelet, Comprehensive metabolic panel  Encounter for long-term (current) use of medications - Plan: CBC with Differential/Platelet, Comprehensive metabolic panel, Lipid panel  Encouraged him to continue to take good care of himself. Return here on an annual basis. He did not want a flu shot

## 2017-04-05 LAB — CBC WITH DIFFERENTIAL/PLATELET
BASOS PCT: 0.7 %
Basophils Absolute: 48 cells/uL (ref 0–200)
EOS PCT: 3.3 %
Eosinophils Absolute: 228 cells/uL (ref 15–500)
HEMATOCRIT: 39.8 % (ref 38.5–50.0)
Hemoglobin: 13.7 g/dL (ref 13.2–17.1)
Lymphs Abs: 2249 cells/uL (ref 850–3900)
MCH: 30.2 pg (ref 27.0–33.0)
MCHC: 34.4 g/dL (ref 32.0–36.0)
MCV: 87.9 fL (ref 80.0–100.0)
MPV: 10.2 fL (ref 7.5–12.5)
Monocytes Relative: 8 %
NEUTROS ABS: 3823 {cells}/uL (ref 1500–7800)
NEUTROS PCT: 55.4 %
PLATELETS: 219 10*3/uL (ref 140–400)
RBC: 4.53 10*6/uL (ref 4.20–5.80)
RDW: 12.5 % (ref 11.0–15.0)
Total Lymphocyte: 32.6 %
WBC mixed population: 552 cells/uL (ref 200–950)
WBC: 6.9 10*3/uL (ref 3.8–10.8)

## 2017-04-05 LAB — COMPREHENSIVE METABOLIC PANEL
AG Ratio: 2 (calc) (ref 1.0–2.5)
ALBUMIN MSPROF: 4.6 g/dL (ref 3.6–5.1)
ALT: 17 U/L (ref 9–46)
AST: 22 U/L (ref 10–40)
Alkaline phosphatase (APISO): 60 U/L (ref 40–115)
BILIRUBIN TOTAL: 0.8 mg/dL (ref 0.2–1.2)
BUN: 8 mg/dL (ref 7–25)
CALCIUM: 9.1 mg/dL (ref 8.6–10.3)
CO2: 27 mmol/L (ref 20–32)
CREATININE: 0.82 mg/dL (ref 0.60–1.35)
Chloride: 104 mmol/L (ref 98–110)
GLOBULIN: 2.3 g/dL (ref 1.9–3.7)
Glucose, Bld: 81 mg/dL (ref 65–99)
POTASSIUM: 4.1 mmol/L (ref 3.5–5.3)
SODIUM: 140 mmol/L (ref 135–146)
TOTAL PROTEIN: 6.9 g/dL (ref 6.1–8.1)

## 2017-04-05 LAB — LIPID PANEL
Cholesterol: 157 mg/dL (ref <200)
HDL: 40 mg/dL — AB (ref >40)
LDL Cholesterol (Calc): 102 mg/dL (calc) — ABNORMAL HIGH
Non-HDL Cholesterol (Calc): 117 mg/dL (calc) (ref <130)
TRIGLYCERIDES: 61 mg/dL (ref <150)
Total CHOL/HDL Ratio: 3.9 (calc) (ref <5.0)

## 2017-04-06 ENCOUNTER — Telehealth: Payer: Self-pay | Admitting: Family Medicine

## 2017-04-06 ENCOUNTER — Encounter: Payer: Self-pay | Admitting: Family Medicine

## 2017-04-06 NOTE — Telephone Encounter (Signed)
[facility]. Sending back for review.

## 2017-04-06 NOTE — Telephone Encounter (Signed)
Abstracted. Javier Cantrell/RLB

## 2017-04-06 NOTE — Telephone Encounter (Signed)
His colonoscopy in 2011 showed hyperplastic polyps were he will not need a follow-up until 2021

## 2017-04-07 ENCOUNTER — Encounter: Payer: Self-pay | Admitting: Family Medicine

## 2017-06-20 ENCOUNTER — Encounter: Payer: Self-pay | Admitting: Family Medicine

## 2017-06-20 ENCOUNTER — Ambulatory Visit: Payer: 59 | Admitting: Family Medicine

## 2017-06-20 VITALS — BP 120/80 | HR 74 | Temp 98.7°F | Wt 135.6 lb

## 2017-06-20 DIAGNOSIS — R52 Pain, unspecified: Secondary | ICD-10-CM

## 2017-06-20 DIAGNOSIS — R110 Nausea: Secondary | ICD-10-CM | POA: Diagnosis not present

## 2017-06-20 DIAGNOSIS — R6883 Chills (without fever): Secondary | ICD-10-CM | POA: Diagnosis not present

## 2017-06-20 DIAGNOSIS — R5383 Other fatigue: Secondary | ICD-10-CM

## 2017-06-20 LAB — CBC WITH DIFFERENTIAL/PLATELET
BASOS PCT: 0.3 %
Basophils Absolute: 29 cells/uL (ref 0–200)
EOS PCT: 0.6 %
Eosinophils Absolute: 57 cells/uL (ref 15–500)
HEMATOCRIT: 44.6 % (ref 38.5–50.0)
Hemoglobin: 15.1 g/dL (ref 13.2–17.1)
LYMPHS ABS: 1881 {cells}/uL (ref 850–3900)
MCH: 30 pg (ref 27.0–33.0)
MCHC: 33.9 g/dL (ref 32.0–36.0)
MCV: 88.7 fL (ref 80.0–100.0)
MPV: 10.3 fL (ref 7.5–12.5)
Monocytes Relative: 6.1 %
Neutro Abs: 6954 cells/uL (ref 1500–7800)
Neutrophils Relative %: 73.2 %
PLATELETS: 262 10*3/uL (ref 140–400)
RBC: 5.03 10*6/uL (ref 4.20–5.80)
RDW: 11.7 % (ref 11.0–15.0)
TOTAL LYMPHOCYTE: 19.8 %
WBC mixed population: 580 cells/uL (ref 200–950)
WBC: 9.5 10*3/uL (ref 3.8–10.8)

## 2017-06-20 LAB — POCT URINALYSIS DIP (PROADVANTAGE DEVICE)
Bilirubin, UA: NEGATIVE
Blood, UA: NEGATIVE
Glucose, UA: NEGATIVE mg/dL
LEUKOCYTES UA: NEGATIVE
NITRITE UA: NEGATIVE
SPECIFIC GRAVITY, URINE: 1.03
Urobilinogen, Ur: NEGATIVE
pH, UA: 6 (ref 5.0–8.0)

## 2017-06-20 LAB — COMPREHENSIVE METABOLIC PANEL
AG Ratio: 2 (calc) (ref 1.0–2.5)
ALBUMIN MSPROF: 4.9 g/dL (ref 3.6–5.1)
ALT: 26 U/L (ref 9–46)
AST: 24 U/L (ref 10–40)
Alkaline phosphatase (APISO): 76 U/L (ref 40–115)
BUN: 11 mg/dL (ref 7–25)
CO2: 27 mmol/L (ref 20–32)
CREATININE: 0.84 mg/dL (ref 0.60–1.35)
Calcium: 9.7 mg/dL (ref 8.6–10.3)
Chloride: 102 mmol/L (ref 98–110)
GLOBULIN: 2.4 g/dL (ref 1.9–3.7)
Glucose, Bld: 95 mg/dL (ref 65–99)
POTASSIUM: 4 mmol/L (ref 3.5–5.3)
SODIUM: 140 mmol/L (ref 135–146)
TOTAL PROTEIN: 7.3 g/dL (ref 6.1–8.1)
Total Bilirubin: 0.8 mg/dL (ref 0.2–1.2)

## 2017-06-20 LAB — POCT INFLUENZA A/B
Influenza A, POC: NEGATIVE
Influenza B, POC: NEGATIVE

## 2017-06-20 LAB — GLUCOSE, POCT (MANUAL RESULT ENTRY): POC Glucose: 95 mg/dl (ref 70–99)

## 2017-06-20 MED ORDER — ONDANSETRON 4 MG PO TBDP
4.0000 mg | ORAL_TABLET | Freq: Three times a day (TID) | ORAL | 0 refills | Status: DC | PRN
Start: 2017-06-20 — End: 2022-11-01

## 2017-06-20 NOTE — Progress Notes (Signed)
flu

## 2017-06-20 NOTE — Progress Notes (Signed)
Subjective:    Patient ID: Javier Cantrell, male    DOB: 01/03/1971, 46 y.o.   MRN: [id number]  HPI Chief Complaint  Patient presents with  . Headache    Stomache ache, both sides hurting, sob, last week coming and going  . Cough    chills  . Sinusitis    nausea, no appetite   He is here with complaints of a 1 week history of intermittent nausea, chills, frontal headache, loss of appetite, rhinorrhea, post nasal drainage, and diffuse abdominal discomfort.  States today he woke up with low back pain. Feels achy.  Denies fever, dizziness, sore throat, ear pain, cough, chest pain, palpitations, shortness of breath, vomiting, diarrhea, constipation. Reports having a normal bowel movement last night without blood or pus.  No rash or arthralgias.   States he has had only 1 glass of gatorade since yesterday. Decreased urinary output. States he cannot eat or drink due to nausea.   History of underlying allergies.   Reviewed allergies, medications, past medical, surgical, and social history.   Review of Systems Pertinent positives and negatives in the history of present illness.     Objective:   Physical Exam  Constitutional: He is oriented to person, place, and time. He appears well-developed and well-nourished. He has a sickly appearance. No distress.  HENT:  Nose: Nose normal.  Mouth/Throat: Uvula is midline, oropharynx is clear and moist and mucous membranes are normal.  Eyes: Conjunctivae and lids are normal. Pupils are equal, round, and reactive to light.  Neck: Full passive range of motion without pain. Neck supple.  Cardiovascular: Normal rate, regular rhythm, normal heart sounds and normal pulses.  No LE edema  Pulmonary/Chest: Effort normal and breath sounds normal.  Abdominal: Soft. Normal appearance. Bowel sounds are increased. There is no hepatosplenomegaly. There is no tenderness. There is no rigidity, no rebound, no guarding, no CVA tenderness, no tenderness at  McBurney's point and negative Murphy's sign.  Lymphadenopathy:    He has no cervical adenopathy.       Right: No supraclavicular adenopathy present.       Left: No supraclavicular adenopathy present.  Neurological: He is alert and oriented to person, place, and time. No cranial nerve deficit or sensory deficit.  Skin: Skin is warm and dry. No pallor.  Psychiatric: He has a normal mood and affect. His speech is normal and behavior is normal. Thought content normal.   BP 120/80   Pulse 74   Temp 98.7 F (37.1 C) (Oral)   Wt 135 lb 9.6 oz (61.5 kg)   SpO2 98%   BMI 24.02 kg/m      Assessment & Plan:  Nausea - Plan: CBC with Differential/Platelet, Comprehensive metabolic panel, POCT glucose (manual entry), POCT Urinalysis DIP (Proadvantage Device), ondansetron (ZOFRAN ODT) 4 MG disintegrating tablet  Chills without fever - Plan: CBC with Differential/Platelet, Comprehensive metabolic panel, POCT glucose (manual entry), POCT Urinalysis DIP (Proadvantage Device), Influenza A/B  Body aches - Plan: POCT Urinalysis DIP (Proadvantage Device), Influenza A/B  Fatigue, unspecified type - Plan: CBC with Differential/Platelet, Comprehensive metabolic panel, POCT glucose (manual entry), POCT Urinalysis DIP (Proadvantage Device), Influenza A/B  POCT random glucose 95.  UA dipstick: trace ketones and protein. Spec grav 1.030  Negative flu swab result.  Discussed that his symptoms are vague and no obvious signs of an infectious process. No obvious etiology.  Zofran ODT sent to pharmacy. He was able to keep down a glass of water in the office. Encouraged  him to increase fluid intake and food as tolerated.  Check labs.  He will call or return with any new or worsening symptoms. Asked him to call me tomorrow to let me know how he is feeling.  Advised of red flags and when to seek medical care.

## 2017-06-20 NOTE — Patient Instructions (Addendum)
Your symptoms are vague and not pointing to any specific infectious process. Your blood sugar is normal.   Take the Zofran and try in increase fluids and bland foods.   Call or return if you notice any new or worsening symptoms. Call me tomorrow and let me know how you are doing.   We will call you with your lab results.

## 2017-07-09 ENCOUNTER — Other Ambulatory Visit: Payer: Self-pay | Admitting: Family Medicine

## 2017-07-09 DIAGNOSIS — L409 Psoriasis, unspecified: Secondary | ICD-10-CM

## 2017-07-10 ENCOUNTER — Telehealth: Payer: Self-pay | Admitting: Family Medicine

## 2017-07-10 MED ORDER — OMEPRAZOLE 20 MG PO CPDR: 20.0000 mg | DELAYED_RELEASE_CAPSULE | Freq: Every day | ORAL | 3 refills | Status: DC

## 2017-07-10 NOTE — Telephone Encounter (Signed)
Pt called and states that he needs a refill on his omeprazole 20 mg states that he has real bad stomach acid, and that helps a lot states he was on his before back in 2013, pt can be reached at [phone] and pt uses [id number], [place] - [address]

## 2017-07-10 NOTE — Telephone Encounter (Signed)
Is this okay to refill? 

## 2018-06-07 ENCOUNTER — Other Ambulatory Visit: Payer: Self-pay

## 2018-06-07 MED ORDER — OMEPRAZOLE 20 MG PO CPDR: 20.0000 mg | DELAYED_RELEASE_CAPSULE | Freq: Every day | ORAL | 0 refills | Status: DC

## 2018-06-09 ENCOUNTER — Other Ambulatory Visit: Payer: Self-pay | Admitting: Family Medicine

## 2018-06-09 DIAGNOSIS — E785 Hyperlipidemia, unspecified: Secondary | ICD-10-CM

## 2018-06-11 ENCOUNTER — Encounter: Payer: Self-pay | Admitting: Family Medicine

## 2018-06-11 ENCOUNTER — Ambulatory Visit: Payer: BLUE CROSS/BLUE SHIELD | Admitting: Family Medicine

## 2018-06-11 VITALS — BP 106/68 | HR 80 | Temp 98.1°F | Wt 138.0 lb

## 2018-06-11 DIAGNOSIS — E785 Hyperlipidemia, unspecified: Secondary | ICD-10-CM

## 2018-06-11 DIAGNOSIS — K6289 Other specified diseases of anus and rectum: Secondary | ICD-10-CM

## 2018-06-11 DIAGNOSIS — L409 Psoriasis, unspecified: Secondary | ICD-10-CM | POA: Diagnosis not present

## 2018-06-11 DIAGNOSIS — Z79899 Other long term (current) drug therapy: Secondary | ICD-10-CM

## 2018-06-11 DIAGNOSIS — R3915 Urgency of urination: Secondary | ICD-10-CM

## 2018-06-11 DIAGNOSIS — K635 Polyp of colon: Secondary | ICD-10-CM | POA: Diagnosis not present

## 2018-06-11 DIAGNOSIS — J309 Allergic rhinitis, unspecified: Secondary | ICD-10-CM

## 2018-06-11 DIAGNOSIS — K219 Gastro-esophageal reflux disease without esophagitis: Secondary | ICD-10-CM

## 2018-06-11 NOTE — Progress Notes (Signed)
   Subjective:    Patient ID: Javier Cantrell, male    DOB: 06/26/1971, 47 y.o.   MRN: [id number]  HPI He is here for a med check appointment.  He does complain of a 4070-month history of urinary urgency and post micturition leakage.  No dysuria, abdominal pain, decreased stream, hesitancy Last week he apparently had a viral illness.  He had several BMs and then after his last p.m. he had a lot of rectal discomfort that lasted the rest the day.  He then related the fact that over the last year he has had a change in his bowel habits now having more frequent but thinner consistency of the stool.  He apparently had a colonoscopy in 2011 and was supposed to go back for follow-up but did not.  Apparently they did find a polyp but I do not have the path report. He does have reflux disease but seems to have this under good control with Prilosec.  His allergies are under good control.  He is using Zyrtec.  He continues on Lipitor and is having no muscle aches or pains with that.  He does not smoke cigarettes but does smoke marijuana and does not drink.  Psoriasis causes very little difficulty.  Review of Systems     Objective:   Physical Exam Alert and in no distress. Tympanic membranes and canals are normal. Pharyngeal area is normal. Neck is supple without adenopathy or thyromegaly. Cardiac exam shows a regular sinus rhythm without murmurs or gallops. Lungs are clear to auscultation.  Abdominal exam shows no masses or tenderness.  Genitalia normal circumcised male.  Rectal exam shows no hemorrhoid present stool was guaiac negative.  He complained of tenderness in all quadrants. Urine dipstick was negative guaiac was negative.       Assessment & Plan:  Urinary urgency  Rectal pain  Polyp of colon, unspecified part of colon, unspecified type  Psoriasis  Allergic rhinitis, mild  Encounter for long-term (current) use of medications  Hyperlipidemia with target LDL less than 100  Gastroesophageal  reflux disease, esophagitis presence not specified He will need follow-up with GI concerning the polyp, rectal discomfort and change in stool habit..  I will get the pathology report for our records. We will also have urology see him due to his urinary urgency.  He is quite young to have this kind of a problem.

## 2018-06-12 ENCOUNTER — Encounter: Payer: Self-pay | Admitting: Gastroenterology

## 2018-06-12 LAB — COMPREHENSIVE METABOLIC PANEL
ALK PHOS: 85 IU/L (ref 39–117)
ALT: 37 IU/L (ref 0–44)
AST: 28 IU/L (ref 0–40)
Albumin/Globulin Ratio: 1.9 (ref 1.2–2.2)
Albumin: 4.6 g/dL (ref 3.5–5.5)
BUN/Creatinine Ratio: 10 (ref 9–20)
BUN: 9 mg/dL (ref 6–24)
Bilirubin Total: 0.4 mg/dL (ref 0.0–1.2)
CALCIUM: 9.7 mg/dL (ref 8.7–10.2)
CO2: 25 mmol/L (ref 20–29)
CREATININE: 0.86 mg/dL (ref 0.76–1.27)
Chloride: 101 mmol/L (ref 96–106)
GFR calc Af Amer: 118 mL/min/{1.73_m2} (ref >59)
GFR, EST NON AFRICAN AMERICAN: 102 mL/min/{1.73_m2} (ref >59)
GLUCOSE: 74 mg/dL (ref 65–99)
Globulin, Total: 2.4 g/dL (ref 1.5–4.5)
Potassium: 4.6 mmol/L (ref 3.5–5.2)
Sodium: 141 mmol/L (ref 134–144)
Total Protein: 7 g/dL (ref 6.0–8.5)

## 2018-06-12 LAB — CBC WITH DIFFERENTIAL/PLATELET
Basophils Absolute: 0.1 10*3/uL (ref 0.0–0.2)
Basos: 1 %
EOS (ABSOLUTE): 0.2 10*3/uL (ref 0.0–0.4)
EOS: 2 %
HEMATOCRIT: 43.5 % (ref 37.5–51.0)
HEMOGLOBIN: 14.7 g/dL (ref 13.0–17.7)
IMMATURE GRANS (ABS): 0 10*3/uL (ref 0.0–0.1)
Immature Granulocytes: 0 %
Lymphocytes Absolute: 2.1 10*3/uL (ref 0.7–3.1)
Lymphs: 26 %
MCH: 29.8 pg (ref 26.6–33.0)
MCHC: 33.8 g/dL (ref 31.5–35.7)
MCV: 88 fL (ref 79–97)
MONOS ABS: 0.7 10*3/uL (ref 0.1–0.9)
Monocytes: 8 %
NEUTROS PCT: 63 %
Neutrophils Absolute: 5 10*3/uL (ref 1.4–7.0)
PLATELETS: 264 10*3/uL (ref 150–450)
RBC: 4.94 x10E6/uL (ref 4.14–5.80)
RDW: 12 % — ABNORMAL LOW (ref 12.3–15.4)
WBC: 7.9 10*3/uL (ref 3.4–10.8)

## 2018-06-12 LAB — LIPID PANEL
CHOL/HDL RATIO: 3.6 ratio (ref 0.0–5.0)
CHOLESTEROL TOTAL: 152 mg/dL (ref 100–199)
HDL: 42 mg/dL (ref >39)
LDL CALC: 88 mg/dL (ref 0–99)
Triglycerides: 112 mg/dL (ref 0–149)
VLDL Cholesterol Cal: 22 mg/dL (ref 5–40)

## 2018-07-10 ENCOUNTER — Encounter: Payer: Self-pay | Admitting: Gastroenterology

## 2018-07-10 ENCOUNTER — Ambulatory Visit: Payer: BLUE CROSS/BLUE SHIELD | Admitting: Gastroenterology

## 2018-07-10 VITALS — BP 120/80 | HR 64 | Ht 62.25 in | Wt 138.4 lb

## 2018-07-10 DIAGNOSIS — R140 Abdominal distension (gaseous): Secondary | ICD-10-CM | POA: Diagnosis not present

## 2018-07-10 DIAGNOSIS — R194 Change in bowel habit: Secondary | ICD-10-CM | POA: Diagnosis not present

## 2018-07-10 DIAGNOSIS — K6289 Other specified diseases of anus and rectum: Secondary | ICD-10-CM | POA: Diagnosis not present

## 2018-07-10 MED ORDER — PEG-KCL-NACL-NASULF-NA ASC-C 140 G PO SOLR: 140.0000 g | ORAL | 0 refills | Status: DC

## 2018-07-10 NOTE — Patient Instructions (Addendum)
If you are age 49 or older, your body mass index should be between 23-30. Your Body mass index is 25.11 kg/m. If this is out of the aforementioned range listed, please consider follow up with your Primary Care Provider.  If you are age 49 or younger, your body mass index should be between 19-25. Your Body mass index is 25.11 kg/m. If this is out of the aformentioned range listed, please consider follow up with your Primary Care Provider.   You have been scheduled for a colonoscopy. Please follow written instructions given to you at your visit today.  Please pick up your prep supplies at the pharmacy within the next 1-3 days. If you use inhalers (even only as needed), please bring them with you on the day of your procedure. Your physician has requested that you go to www.startemmi.com and enter the access code given to you at your visit today. This web site gives a general overview about your procedure. However, you should still follow specific instructions given to you by our office regarding your preparation for the procedure.  It was a pleasure to see you today!  Dr. Myrtie Neitheranis       _________________________________________________________________________________   Food Guidelines for gas and bloating  Many people have difficulty digesting certain foods, causing a variety of distressing and embarrassing symptoms such as abdominal pain, bloating and gas.  These foods may need to be avoided or consumed in small amounts.  Here are some tips that might be helpful for you.  1.   Lactose intolerance is the difficulty or complete inability to digest lactose, the natural sugar in milk and anything made from milk.  This condition is harmless, common, and can begin any time during life.  Some people can digest a modest amount of lactose while others cannot tolerate any.  Also, not all dairy products contain equal amounts of lactose.  For example, hard cheeses such as parmesan have less lactose than  soft cheeses such as cheddar.  Yogurt has less lactose than milk or cheese.  Many packaged foods (even many brands of bread) have milk, so read ingredient lists carefully.  It is difficult to test for lactose intolerance, so just try avoiding lactose as much as possible for a week and see what happens with your symptoms.  If you seem to be lactose intolerant, the best plan is to avoid it (but make sure you get calcium from another source).  The next best thing is to use lactase enzyme supplements, available over the counter everywhere.  Just know that many lactose intolerant people need to take several tablets with each serving of dairy to avoid symptoms.  Lastly, a lot of restaurant food is made with milk or butter.  Many are things you might not suspect, such as mashed potatoes, rice and pasta (cooked with butter) and "grilled" items.  If you are lactose intolerant, it never hurts to ask your server what has milk or butter.  2.   Fiber is an important part of your diet, but not all fiber is well-tolerated.  Insoluble fiber such as bran is often consumed by normal gut bacteria and converted into gas.  Soluble fiber such as oats, squash, carrots and green beans are typically tolerated better.  3.   Some types of carbohydrates can be poorly digested.  Examples include: fructose (apples, cherries, pears, raisins and other dried fruits), fructans (onions, zucchini, large amounts of wheat), sorbitol/mannitol/xylitol and sucralose/Splenda (common artificial sweeteners), and raffinose (lentils, broccoli, cabbage, asparagus,  brussel sprouts, many types of beans).  Do a Programmer, multimedia for [employer] and you will find helpful information. Beano, a dietary supplement, will often help with raffinose-containing foods.  As with lactase tablets, you may need several per serving.  4.   Whenever possible, avoid processed food&meats and chemical additives.  High fructose corn syrup, a common sweetener, may be difficult to  digest.  Eggs and soy (comes from the soybean, and added to many foods now) are other common bloating/gassy foods.  - Dr. [facility]

## 2018-07-10 NOTE — Progress Notes (Addendum)
[facility] Consult Note:  History: Javier HeckRichard W Cantrell 07/10/2018  Referring physician: Ronnald NianLalonde, John C, MD  Reason for consult/chief complaint: Rectal Pain (had a recent prostate exam and had rectal pain when Dr. Sedalia Mutamashed on area); Constipation (had a BM that wouldn't come out and having a lot of straining); Gas (severe gas pains); Abdominal Pain (epigastric/upper abd pain and also some lower abd pains); Nausea (intermittent); and Dehydration (seems to stay dehydrated although he drinks a lot of water)   Subjective  HPI:  This is a pleasant 47 year old man referred by primary care after recent episode of severe constipation and rectal pain.  He is a somewhat limited historian, but describes it over the last year to year and a half, his bowel habits have changed from one formed stool every morning to 2-3 stools but the day that are smaller volume and caliber.  Perhaps a month ago he had an episode of constipation with a large stool and the need to strain and he had great difficulty passing it.  He had some pain but no bleeding afterwards, and went to primary care.  Javier Cantrell describes that her rectal exam was painful.  Since then he has try to get more water every day, but says that he has had several episodes of dehydration over the last year for unclear reasons.  He has some upper abdominal bloating at times and this seems to move from his chest to upper to lower abdomen.  His appetite is been good with no weight loss.  He denies family history of colorectal cancer. Javier Cantrell also describes a colon[facility] ago.  He believes a polyp was removed and that he was supposed to have another exam in 5 years.  These records are not currently available, but a request for them will be made. ROS:  Review of Systems  Constitutional: Negative for appetite change and unexpected weight change.  HENT: Negative for mouth sores and voice change.   Eyes:  Negative for pain and redness.  Respiratory: Negative for cough and shortness of breath.   Cardiovascular: Negative for chest pain and palpitations.  Gastrointestinal:       Heartburn  Genitourinary: Negative for dysuria and hematuria.  Musculoskeletal: Positive for arthralgias. Negative for myalgias.  Skin: Negative for pallor and rash.  Neurological: Negative for weakness and headaches.  Hematological: Negative for adenopathy.     Past Medical History: Past Medical History:  Diagnosis Date  . Allergic rhinitis   . GERD (gastroesophageal reflux disease)   . History of colon polyps   . Hyperlipidemia   . Kidney stones   . Psoriasis   . Urinary urgency      Past Surgical History: Past Surgical History:  Procedure Laterality Date  . COLONOSCOPY     Dr Noe GensPeters hyperplastic polyp  . KIDNEY STONE SURGERY       Family History: Family History  Problem Relation Age of Onset  . Kidney Stones Father     Social History: Social History   Socioeconomic History  . Marital status: Married    Spouse name: Not on file  . Number of children: 1  . Years of education: Not on file  . Highest education level: Not on file  Occupational History  . Occupation: Conservator, [employer]  Social Needs  . Financial resource strain: Not on file  . Food insecurity:    Worry: Not on file    Inability: Not on file  . Transportation  needs:    Medical: Not on file    Non-medical: Not on file  Tobacco Use  . Smoking status: Former Smoker    Types: Cigarettes  . Smokeless tobacco: Never Used  . Tobacco comment: school years for about 1 year  Substance and Sexual Activity  . Alcohol use: Not Currently  . Drug use: Yes    Types: Marijuana  . Sexual activity: Yes  Lifestyle  . Physical activity:    Days per week: Not on file    Minutes per session: Not on file  . Stress: Not on file  Relationships  . Social connections:    Talks on phone: Not on file    Gets together: Not on file    Attends  religious service: Not on file    Active member of club or organization: Not on file    Attends meetings of clubs or organizations: Not on file    Relationship status: Not on file  Other Topics Concern  . Not on file  Social History Narrative  . Not on file   He works at a company Engineer, manufacturing.  Allergies: No Known Allergies  Outpatient Meds: Current Outpatient Medications  Medication Sig Dispense Refill  . atorvastatin (LIPITOR) 20 MG tablet TAKE 1 TABLET BY MOUTH EVERY DAY 90 tablet 2  . cetirizine (ZYRTEC) 10 MG tablet Take 10 mg by mouth daily. PRN as needed    . omeprazole (PRILOSEC) 20 MG capsule Take 1 capsule (20 mg total) by mouth daily. 30 capsule 0  . triamcinolone cream (KENALOG) 0.1 % APPLY TO AFFECTED AREA TWICE A DAY 45 g 2  . ondansetron (ZOFRAN ODT) 4 MG disintegrating tablet Take 1 tablet (4 mg total) by mouth every 8 (eight) hours as needed for nausea or vomiting. (Patient not taking: Reported on 06/11/2018) 20 tablet 0  . PEG-KCl-NaCl-NaSulf-Na Asc-C (PLENVU) 140 g SOLR Take 140 g by mouth as directed. 1 each 0   No current facility-administered medications for this visit.       ___________________________________________________________________ Objective   Exam:  BP 120/80 (BP Location: Left Arm, Patient Position: Sitting, Cuff Size: Normal)   Pulse 64   Ht 5' 2.25" (1.581 m) Comment: height measured without shoes  Wt 138 lb 6 oz (62.8 kg)   BMI 25.11 kg/m    General: this is a(n) well-appearing man  Eyes: sclera anicteric, no redness  ENT: oral mucosa moist without lesions, no cervical or supraclavicular lymphadenopathy, good dentition  CV: RRR without murmur, S1/S2, no JVD, no peripheral edema  Resp: clear to auscultation bilaterally, normal RR and effort noted  GI: soft, no tenderness, with active bowel sounds. No guarding or palpable organomegaly noted.  Skin; warm and dry, no rash or jaundice noted  Neuro: awake, alert  and oriented x 3. Normal gross motor function and fluent speech Rectal: Normal external, no tenderness or palpable internal lesions, no fissure.  Labs:  CBC Latest Ref Rng & Units 06/11/2018 06/20/2017 04/04/2017  WBC 3.4 - 10.8 x10E3/uL 7.9 9.5 6.9  Hemoglobin 13.0 - 17.7 g/dL 16.1 09.6 04.5  Hematocrit 37.5 - 51.0 % 43.5 44.6 39.8  Platelets 150 - 450 x10E3/uL 264 262 219   CMP Latest Ref Rng & Units 06/11/2018 06/20/2017 04/04/2017  Glucose 65 - 99 mg/dL 74 95 81  BUN 6 - 24 mg/dL 9 11 8   Creatinine 0.76 - 1.27 mg/dL 4.09 8.11 9.14  Sodium 134 - 144 mmol/L 141 140 140  Potassium 3.5 - 5.2  mmol/L 4.6 4.0 4.1  Chloride 96 - 106 mmol/L 101 102 104  CO2 20 - 29 mmol/L 25 27 27   Calcium 8.7 - 10.2 mg/dL 9.7 9.7 9.1  Total Protein 6.0 - 8.5 g/dL 7.0 7.3 6.9  Total Bilirubin 0.0 - 1.2 mg/dL 0.4 0.8 0.8  Alkaline Phos 39 - 117 IU/L 85 - -  AST 0 - 40 IU/L 28 24 22   ALT 0 - 44 IU/L 37 26 17    Assessment: Encounter Diagnoses  Name Primary?  . Change in bowel habit Yes  . Abdominal bloating   . Rectal pain     Over a year of altered bowel habits, recent episode of more severe constipation.  Not clear why he was so tender on primary care rectal exam, but I wonder if he may have had a tear in the anal mucosa that has since healed. We talked about the need for adequate daily fluid intake, dietary fiber and regular exercise to maintain regularity.  Plan:  His change in bowel habits over the last year warrants a colonoscopy to rule out a partial obstructive cause.  He is agreeable after discussion of procedure and risks. We will try to get Dr. Noe Gens records as noted above.  Thank you for the courtesy of this consult.  Please call me with any questions or concerns.  Charlie Pitter III  CC: Referring provider noted above   Addendum:    Records from Dr. Nance Pew 05/2010 Adequate bowel prep, hyperplastic polyp (however, 5 yr colonoscopy recommended for unclear  reasons) Esophageal biopsies without Berrett's or EoE from 05/2010.   - [facility]

## 2018-07-11 ENCOUNTER — Other Ambulatory Visit: Payer: Self-pay | Admitting: Family Medicine

## 2018-07-20 ENCOUNTER — Telehealth: Payer: Self-pay | Admitting: Family Medicine

## 2018-07-20 ENCOUNTER — Telehealth: Payer: Self-pay

## 2018-07-20 MED ORDER — OMEPRAZOLE 40 MG PO CPDR: 40.0000 mg | DELAYED_RELEASE_CAPSULE | Freq: Every day | ORAL | 3 refills | Status: DC

## 2018-07-20 NOTE — Telephone Encounter (Signed)
Pt informed of refill & medication change

## 2018-07-20 NOTE — Telephone Encounter (Signed)
Patient called and stated he is needing a refill on Omeprazole as it was sent to the pharmacy. He is requesting the dose be increased to 40mg . Please advise change.

## 2018-08-02 ENCOUNTER — Encounter: Payer: BLUE CROSS/BLUE SHIELD | Admitting: Gastroenterology

## 2018-08-13 ENCOUNTER — Encounter: Payer: Self-pay | Admitting: Family Medicine

## 2018-08-28 ENCOUNTER — Ambulatory Visit: Payer: BLUE CROSS/BLUE SHIELD | Admitting: Family Medicine

## 2018-08-28 ENCOUNTER — Encounter: Payer: Self-pay | Admitting: Family Medicine

## 2018-08-28 VITALS — BP 122/82 | HR 63 | Temp 97.6°F | Wt 136.8 lb

## 2018-08-28 DIAGNOSIS — M549 Dorsalgia, unspecified: Secondary | ICD-10-CM | POA: Diagnosis not present

## 2018-08-28 NOTE — Progress Notes (Signed)
   Subjective:    Patient ID: Javier Cantrell, male    DOB: August 10, 1970, 48 y.o.   MRN: [id number]  HPI He complains of a 40-month history of mid back pain.  He cannot relate this to any injury.  Motion of his back causes no pain.  He does state that food does tend to help the pain however recently has not had as good effect.  He has been on Prilosec.  He also was scheduled to see GI for other issues but because of cost did not follow-up with that.  There is no associated nausea, vomiting, diarrhea, constipation or black stools.   Review of Systems     Objective:   Physical Exam Alert and in no distress.  Full motion of his back without pain.  Abdominal exam shows normal bowel sounds without masses or tenderness.  Rectal deferred but stool guaiac cards given.       Assessment & Plan:  Back pain, unspecified back location, unspecified back pain laterality, unspecified chronicity I explained that although he says he is having back pain, the fact that the symptoms were relieved with food and that motion makes no difference speaks towards this being GI.  I will give him sample of Dexilant that he will take daily for the next 10 days and call me.  If he continues have difficulty we we forced him to send to GI in spite of the cost.  Also to turn in his stool cards.  I explained that this is because the symptoms could easily be ulcer related and need to make sure he is not bleeding.

## 2018-08-30 ENCOUNTER — Other Ambulatory Visit (INDEPENDENT_AMBULATORY_CARE_PROVIDER_SITE_OTHER): Payer: BLUE CROSS/BLUE SHIELD

## 2018-08-30 DIAGNOSIS — Z1211 Encounter for screening for malignant neoplasm of colon: Secondary | ICD-10-CM | POA: Diagnosis not present

## 2018-08-30 DIAGNOSIS — M549 Dorsalgia, unspecified: Secondary | ICD-10-CM

## 2018-08-30 LAB — HEMOCCULT GUIAC POC 1CARD (OFFICE)
Card #2 Fecal Occult Blod, POC: NEGATIVE
Card #3 Fecal Occult Blood, POC: NEGATIVE
FECAL OCCULT BLD: NEGATIVE

## 2018-08-30 LAB — POCT URINALYSIS DIP (PROADVANTAGE DEVICE)
Bilirubin, UA: NEGATIVE
Blood, UA: NEGATIVE
Glucose, UA: NEGATIVE mg/dL
Ketones, POC UA: NEGATIVE mg/dL
Leukocytes, UA: NEGATIVE
NITRITE UA: NEGATIVE
PH UA: 6.5 (ref 5.0–8.0)
Protein Ur, POC: NEGATIVE mg/dL
Specific Gravity, Urine: 1.015
UUROB: NEGATIVE

## 2019-02-11 ENCOUNTER — Other Ambulatory Visit: Payer: Self-pay | Admitting: Family Medicine

## 2019-02-11 DIAGNOSIS — L409 Psoriasis, unspecified: Secondary | ICD-10-CM

## 2019-02-11 DIAGNOSIS — E785 Hyperlipidemia, unspecified: Secondary | ICD-10-CM

## 2019-07-11 ENCOUNTER — Other Ambulatory Visit: Payer: Self-pay | Admitting: Family Medicine

## 2019-07-12 NOTE — Telephone Encounter (Signed)
Pt was advised he is over due for an appt and I would only give him 30 days on med until he can get it. Pt will call back to schedule an appt

## 2019-08-28 ENCOUNTER — Other Ambulatory Visit: Payer: Self-pay | Admitting: Family Medicine

## 2019-09-23 ENCOUNTER — Telehealth: Payer: Self-pay | Admitting: Family Medicine

## 2019-09-23 ENCOUNTER — Other Ambulatory Visit: Payer: Self-pay

## 2019-09-23 MED ORDER — OMEPRAZOLE 40 MG PO CPDR: DELAYED_RELEASE_CAPSULE | ORAL | 2 refills | Status: AC

## 2019-09-23 NOTE — Telephone Encounter (Signed)
Pt was advised [facility] 

## 2019-09-23 NOTE — Telephone Encounter (Signed)
Pt made an appt for a fasting medcheck. He will need omeprazole refilled. He doesn't have enough to last until then [facility] [address] Pt can be reached at [phone].

## 2019-10-07 ENCOUNTER — Encounter: Payer: Self-pay | Admitting: Family Medicine

## 2019-10-07 ENCOUNTER — Ambulatory Visit: Payer: BLUE CROSS/BLUE SHIELD | Admitting: Family Medicine

## 2019-10-07 ENCOUNTER — Other Ambulatory Visit: Payer: Self-pay

## 2019-10-07 VITALS — BP 110/70 | HR 82 | Temp 96.6°F | Wt 137.8 lb

## 2019-10-07 DIAGNOSIS — E785 Hyperlipidemia, unspecified: Secondary | ICD-10-CM

## 2019-10-07 DIAGNOSIS — J309 Allergic rhinitis, unspecified: Secondary | ICD-10-CM

## 2019-10-07 DIAGNOSIS — Z1211 Encounter for screening for malignant neoplasm of colon: Secondary | ICD-10-CM

## 2019-10-07 DIAGNOSIS — Z23 Encounter for immunization: Secondary | ICD-10-CM

## 2019-10-07 DIAGNOSIS — M545 Low back pain, unspecified: Secondary | ICD-10-CM

## 2019-10-07 DIAGNOSIS — K219 Gastro-esophageal reflux disease without esophagitis: Secondary | ICD-10-CM | POA: Diagnosis not present

## 2019-10-07 LAB — CBC WITH DIFFERENTIAL/PLATELET
Basophils Absolute: 0.1 10*3/uL (ref 0.0–0.2)
Basos: 1 %
EOS (ABSOLUTE): 0.2 10*3/uL (ref 0.0–0.4)
Eos: 3 %
Hematocrit: 42 % (ref 37.5–51.0)
Hemoglobin: 14.4 g/dL (ref 13.0–17.7)
Immature Grans (Abs): 0 10*3/uL (ref 0.0–0.1)
Immature Granulocytes: 0 %
Lymphocytes Absolute: 2 10*3/uL (ref 0.7–3.1)
Lymphs: 35 %
MCH: 29.8 pg (ref 26.6–33.0)
MCHC: 34.3 g/dL (ref 31.5–35.7)
MCV: 87 fL (ref 79–97)
Monocytes Absolute: 0.4 10*3/uL (ref 0.1–0.9)
Monocytes: 6 %
Neutrophils Absolute: 3.2 10*3/uL (ref 1.4–7.0)
Neutrophils: 55 %
Platelets: 248 10*3/uL (ref 150–450)
RBC: 4.83 x10E6/uL (ref 4.14–5.80)
RDW: 12 % (ref 11.6–15.4)
WBC: 5.8 10*3/uL (ref 3.4–10.8)

## 2019-10-07 LAB — LIPID PANEL
Chol/HDL Ratio: 3.2 ratio (ref 0.0–5.0)
Cholesterol, Total: 162 mg/dL (ref 100–199)
HDL: 50 mg/dL (ref >39)
LDL Chol Calc (NIH): 103 mg/dL — ABNORMAL HIGH (ref 0–99)
Triglycerides: 40 mg/dL (ref 0–149)
VLDL Cholesterol Cal: 9 mg/dL (ref 5–40)

## 2019-10-07 LAB — COMPREHENSIVE METABOLIC PANEL
ALT: 13 IU/L (ref 0–44)
AST: 22 IU/L (ref 0–40)
Albumin/Globulin Ratio: 2.1 (ref 1.2–2.2)
Albumin: 4.6 g/dL (ref 4.0–5.0)
Alkaline Phosphatase: 77 IU/L (ref 39–117)
BUN/Creatinine Ratio: 16 (ref 9–20)
BUN: 13 mg/dL (ref 6–24)
Bilirubin Total: 0.4 mg/dL (ref 0.0–1.2)
CO2: 24 mmol/L (ref 20–29)
Calcium: 9.4 mg/dL (ref 8.7–10.2)
Chloride: 105 mmol/L (ref 96–106)
Creatinine, Ser: 0.8 mg/dL (ref 0.76–1.27)
GFR calc Af Amer: 120 mL/min/{1.73_m2} (ref >59)
GFR calc non Af Amer: 104 mL/min/{1.73_m2} (ref >59)
Globulin, Total: 2.2 g/dL (ref 1.5–4.5)
Glucose: 97 mg/dL (ref 65–99)
Potassium: 5.1 mmol/L (ref 3.5–5.2)
Sodium: 142 mmol/L (ref 134–144)
Total Protein: 6.8 g/dL (ref 6.0–8.5)

## 2019-10-07 MED ORDER — ATORVASTATIN CALCIUM 20 MG PO TABS: 20.0000 mg | ORAL_TABLET | Freq: Every day | ORAL | 3 refills | Status: DC

## 2019-10-07 NOTE — Patient Instructions (Signed)
Take the Prilosec twice per day for a week or 2 and let me know how you are doing Tylenol for your back rather than Aleve No more chocolate!Marland Kitchen

## 2019-10-07 NOTE — Progress Notes (Addendum)
   Subjective:    Patient ID: Javier Cantrell, male    DOB: 1971-05-16, 49 y.o.   MRN: [id number]  HPI He is here for medication management visit.  He is still having difficulty with chest and back discomfort.  He has been on Prilosec 40 mg daily.  He does state that his swallowing water as well as certain foods help the symptoms go away.  He has had some nausea but no vomiting or feelings of food got stuck.  He notes that chocolate does tend to make the symptoms worse.  He does not drink.  He does have underlying allergies and is using Zyrtec with good results.  He is also taking Lipitor and having no aches or pains with that.  Recently he did injure his back while lifting something at home and reinjured it this last weekend when he is doing work using a [employer].  Presently he has had very little difficulty with that.   Review of Systems     Objective:   Physical Exam Alert and in no distress. Tympanic membranes and canals are normal. Pharyngeal area is normal. Neck is supple without adenopathy or thyromegaly. Cardiac exam shows a regular sinus rhythm without murmurs or gallops. Lungs are clear to auscultation. Abdominal exam shows no masses or tenderness with normal bowel sounds.       Assessment & Plan:  Gastroesophageal reflux disease, unspecified whether esophagitis present - Plan: CBC with Differential, Comprehensive metabolic panel  Allergic rhinitis, mild - Plan: CBC with Differential, Comprehensive metabolic panel  Hyperlipidemia with target LDL less than 100 - Plan: Lipid Panel, atorvastatin (LIPITOR) 20 MG tablet  Screening for colon cancer - Plan: Cologuard  Immunization due He declines of the Tdap. Recommend Tylenol and conservative care for his back pain. He is to avoid chocolate as he knows this does make his symptoms worse.  He is to increase Prilosec to 40 mg twice per day for the next week or 2 and see if that helps with his symptoms.  If not better, I will refer him  to GI.  He was scheduled to see GI in the past but refused because of money.  He will continue on his allergy medications. He called back stating he is having difficulty with low back pain after lifting some objects wrong at home.  X-rays will be ordered

## 2019-10-10 ENCOUNTER — Other Ambulatory Visit: Payer: Self-pay

## 2019-10-10 ENCOUNTER — Telehealth: Payer: Self-pay | Admitting: Family Medicine

## 2019-10-10 ENCOUNTER — Ambulatory Visit
Admission: RE | Admit: 2019-10-10 | Discharge: 2019-10-10 | Disposition: A | Discharge status: Home / Self Care | Payer: BLUE CROSS/BLUE SHIELD | Source: Ambulatory Visit | Attending: Family Medicine | Admitting: Family Medicine

## 2019-10-10 DIAGNOSIS — M545 Low back pain: Secondary | ICD-10-CM | POA: Diagnosis not present

## 2019-10-10 NOTE — Telephone Encounter (Signed)
Pt called and is still having back trouble and wants to see if he could get a referral to get a xray

## 2019-10-10 NOTE — Addendum Note (Signed)
Addended by: Ronnald Nian on: 10/10/2019 12:14 PM   Modules accepted: Orders

## 2019-10-14 DIAGNOSIS — Z1211 Encounter for screening for malignant neoplasm of colon: Secondary | ICD-10-CM | POA: Diagnosis not present

## 2019-10-18 LAB — EXTERNAL GENERIC LAB PROCEDURE: COLOGUARD: NEGATIVE

## 2019-10-18 LAB — COLOGUARD: Cologuard: NEGATIVE

## 2019-10-22 NOTE — Progress Notes (Signed)
Pt was advised of results KH 

## 2019-12-03 ENCOUNTER — Telehealth: Payer: Self-pay | Admitting: Family Medicine

## 2019-12-03 NOTE — Telephone Encounter (Signed)
Pt was advised and he stated he will pick it up tomorrow. KH

## 2019-12-03 NOTE — Telephone Encounter (Signed)
Pt called and states that at his last appt he was advise to take two of the prilosec for a while until his issues improved. He did and now is taking 1 a day but he can't get a refill because pharmacy says it is too early. He ran out do to taking 2 instead of 1. Please help pt to get a refill. Pt uses [facility] on [address] and can be reached at [phone].

## 2020-05-22 ENCOUNTER — Other Ambulatory Visit: Payer: Self-pay | Admitting: Family Medicine

## 2020-05-22 DIAGNOSIS — L409 Psoriasis, unspecified: Secondary | ICD-10-CM

## 2020-05-22 NOTE — Telephone Encounter (Signed)
[facility] is requesting to fill pt triamcinolone. Please advise [facility]

## 2020-11-16 IMAGING — CR DG LUMBAR SPINE COMPLETE 4+V
5 series · 5 of 5 positions shown · non-contrast
Comparison: None.

CLINICAL DATA: Low back pain

EXAM:
LUMBAR SPINE - COMPLETE 4+ VIEW

[t lumbar spine ap]
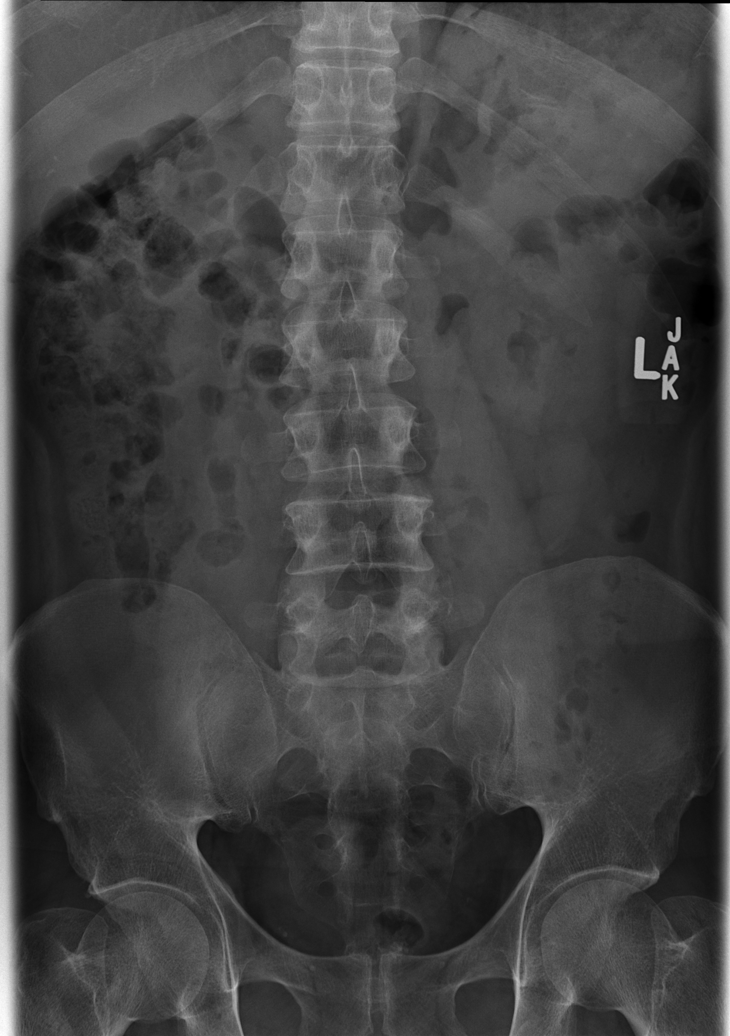

[t lumbar spine obl (1 of 2)]
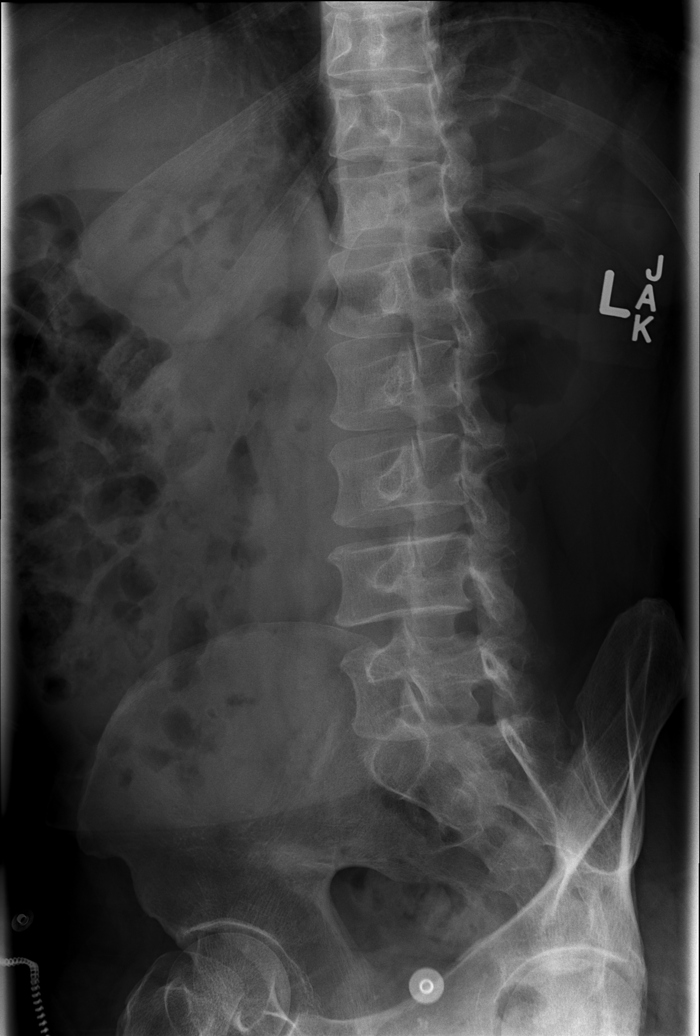

[t lumbar spine obl (2 of 2)]
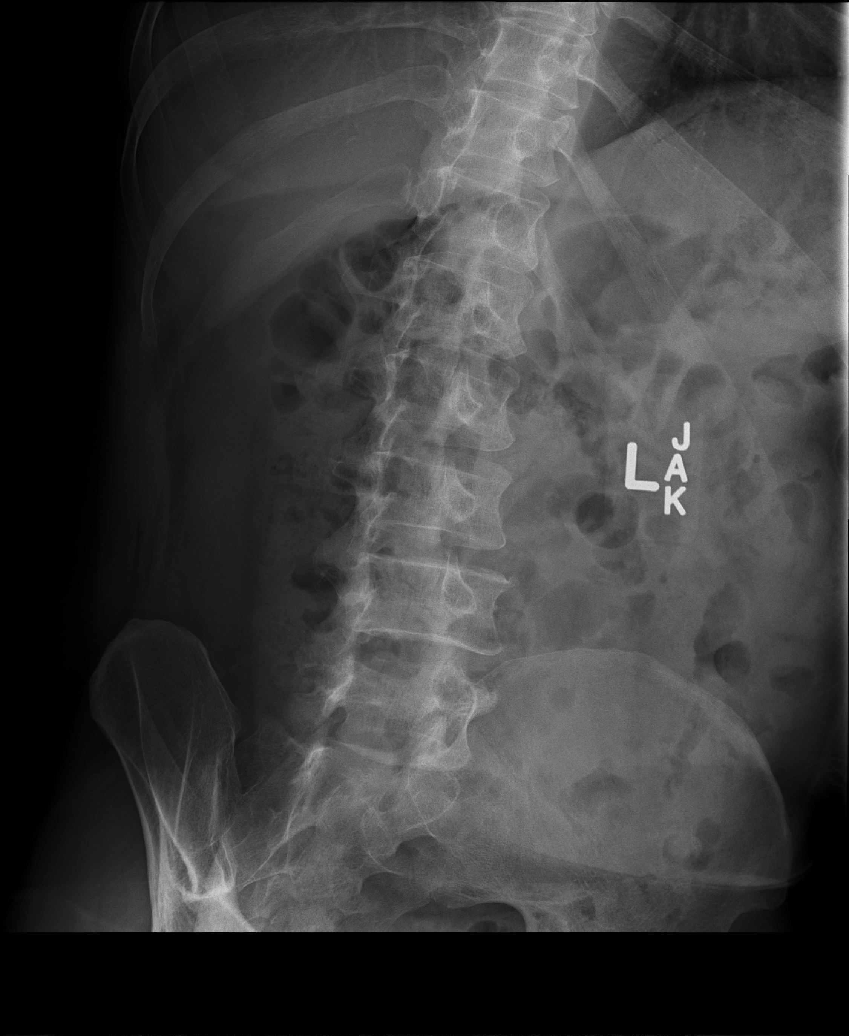

[t lumbar spine lat]
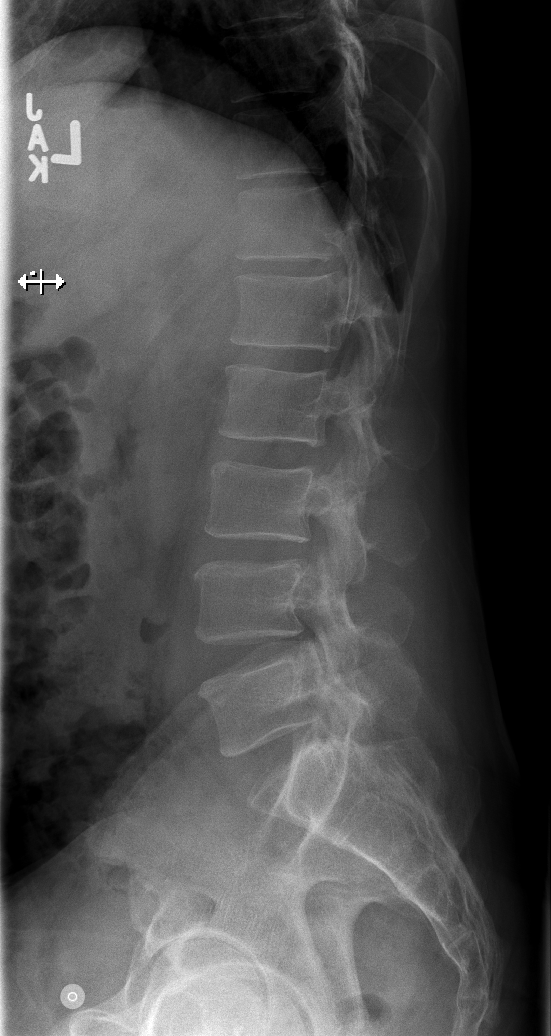

[t lumbar l-5 s-1 spot]
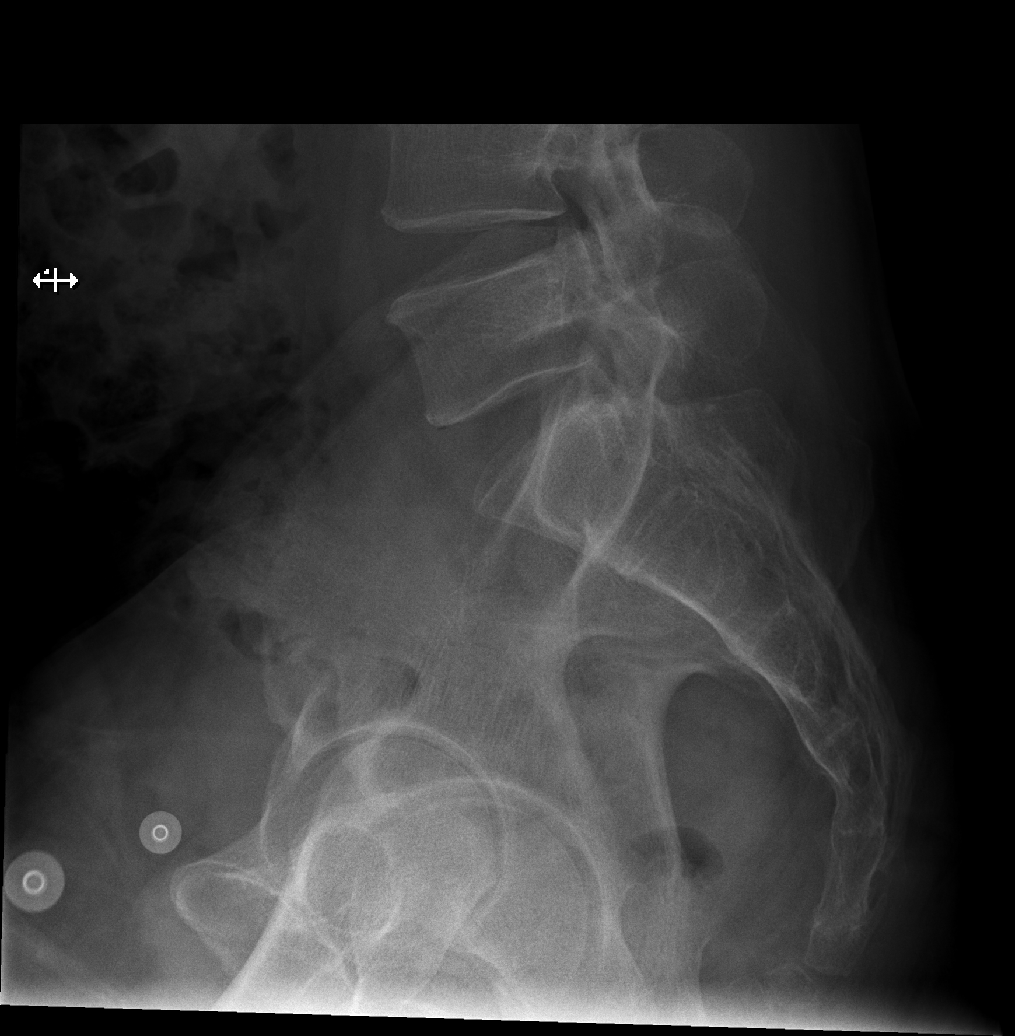

[5 of 5 positions shown; findings below may reference images not displayed]

FINDINGS: Alignment within normal limits. Vertebral body heights are normal.
Disc spaces are maintained. Minimal osteophyte at L4-L5 and L3-L4.
Mild facet degenerative changes of the lower lumbar spine.
IMPRESSION: Minimal degenerative changes.  No acute osseous abnormality

## 2021-02-10 ENCOUNTER — Other Ambulatory Visit: Payer: Self-pay | Admitting: Family Medicine

## 2021-02-10 DIAGNOSIS — L409 Psoriasis, unspecified: Secondary | ICD-10-CM

## 2021-02-10 NOTE — Telephone Encounter (Signed)
Pt was advised that he needs an appointment has he is overdue and I could only refill for 30 days and he declined to schedule

## 2022-11-01 ENCOUNTER — Ambulatory Visit (INDEPENDENT_AMBULATORY_CARE_PROVIDER_SITE_OTHER): Payer: Self-pay | Admitting: Family Medicine

## 2022-11-01 ENCOUNTER — Telehealth: Payer: Self-pay | Admitting: Gastroenterology

## 2022-11-01 ENCOUNTER — Encounter: Payer: Self-pay | Admitting: Family Medicine

## 2022-11-01 ENCOUNTER — Encounter: Payer: Self-pay | Admitting: Gastroenterology

## 2022-11-01 VITALS — BP 132/82 | HR 77 | Temp 98.3°F | Resp 16 | Ht 62.0 in | Wt 124.4 lb

## 2022-11-01 DIAGNOSIS — R634 Abnormal weight loss: Secondary | ICD-10-CM

## 2022-11-01 DIAGNOSIS — R5383 Other fatigue: Secondary | ICD-10-CM

## 2022-11-01 DIAGNOSIS — R195 Other fecal abnormalities: Secondary | ICD-10-CM

## 2022-11-01 LAB — HEMOCCULT GUIAC POC 1CARD (OFFICE): Fecal Occult Blood, POC: POSITIVE — AB

## 2022-11-01 NOTE — Telephone Encounter (Signed)
Spoke with patient, he is are of appointment.  Paperwork mailed

## 2022-11-01 NOTE — Progress Notes (Signed)
   Subjective:    Patient ID: Javier Cantrell, male    DOB: [birth year], 52 y.o.   MRN: [id number]  HPI He is here for evaluation of multiple issues.  He has not been seen here in a couple of years.  He said he stopped drinking and smoking marijuana about 2 to 3 years ago but then started up smoking marijuana about 6 months ago.  He again stopped on March 31.  He has apparently had weight loss over at least the last year but difficult to get me a true timeline on this.  He also complains of some mid back pain.  Within the last couple weeks he has noted the change in his bowel habits having less BMs that are thinner in caliber as well as anorexia and some nausea and feeling shaky.   Review of Systems     Objective:   Physical Exam Alert and in no distress, ill-appearing. Tympanic membranes and canals are normal. Pharyngeal area is normal. Neck is supple without adenopathy or thyromegaly. Cardiac exam shows a regular sinus rhythm without murmurs or gallops. Lungs are clear to auscultation. Abdominal exam shows no masses or tenderness.  Rectal exam showed minimal stool in the vault, brown in consistency and guaiac positive.       Assessment & Plan:  Weight loss - Plan: CBC with Differential/Platelet, Comprehensive metabolic panel, Ambulatory referral to Gastroenterology, POCT occult blood stool  Change in stool caliber - Plan: CBC with Differential/Platelet, Comprehensive metabolic panel, Ambulatory referral to Gastroenterology  Other fatigue - Plan: CBC with Differential/Platelet, Comprehensive metabolic panel, Ambulatory referral to Gastroenterology Explained that I did not think his symptoms are related to his marijuana abstinence especially since he is lost weight while on marijuana regaining it.  He was concerned about withdrawal.  I explained that my concern was his change in bowel habits and the possibility of this being colon cancer related.

## 2022-11-01 NOTE — Telephone Encounter (Signed)
Referral in [facility] for weight loss, changes in stool and fatigue.  Offered patient next available appt and he is requesting a sooner appt.  Please review and advise urgency and scheduling.  Thanks

## 2022-11-01 NOTE — Telephone Encounter (Signed)
Please offer patient an appt with Dr. Myrtie Neither on 11/09/22 at 11 am. You may use this for a new patient appt. Thanks

## 2022-11-02 LAB — CBC WITH DIFFERENTIAL/PLATELET
Basophils Absolute: 0 10*3/uL (ref 0.0–0.2)
Basos: 0 %
EOS (ABSOLUTE): 0.1 10*3/uL (ref 0.0–0.4)
Eos: 1 %
Hematocrit: 46.5 % (ref 37.5–51.0)
Hemoglobin: 16 g/dL (ref 13.0–17.7)
Immature Grans (Abs): 0 10*3/uL (ref 0.0–0.1)
Immature Granulocytes: 0 %
Lymphocytes Absolute: 1.6 10*3/uL (ref 0.7–3.1)
Lymphs: 15 %
MCH: 30.1 pg (ref 26.6–33.0)
MCHC: 34.4 g/dL (ref 31.5–35.7)
MCV: 88 fL (ref 79–97)
Monocytes Absolute: 0.4 10*3/uL (ref 0.1–0.9)
Monocytes: 4 %
Neutrophils Absolute: 8.4 10*3/uL — ABNORMAL HIGH (ref 1.4–7.0)
Neutrophils: 80 %
Platelets: 322 10*3/uL (ref 150–450)
RBC: 5.31 x10E6/uL (ref 4.14–5.80)
RDW: 12.3 % (ref 11.6–15.4)
WBC: 10.5 10*3/uL (ref 3.4–10.8)

## 2022-11-02 LAB — COMPREHENSIVE METABOLIC PANEL
ALT: 15 IU/L (ref 0–44)
AST: 23 IU/L (ref 0–40)
Albumin/Globulin Ratio: 1.6 (ref 1.2–2.2)
Albumin: 4.6 g/dL (ref 3.8–4.9)
Alkaline Phosphatase: 116 IU/L (ref 44–121)
BUN/Creatinine Ratio: 9 (ref 9–20)
BUN: 8 mg/dL (ref 6–24)
Bilirubin Total: 0.5 mg/dL (ref 0.0–1.2)
CO2: 18 mmol/L — ABNORMAL LOW (ref 20–29)
Calcium: 9.9 mg/dL (ref 8.7–10.2)
Chloride: 101 mmol/L (ref 96–106)
Creatinine, Ser: 0.89 mg/dL (ref 0.76–1.27)
Globulin, Total: 2.8 g/dL (ref 1.5–4.5)
Glucose: 115 mg/dL — ABNORMAL HIGH (ref 70–99)
Potassium: 4.4 mmol/L (ref 3.5–5.2)
Sodium: 138 mmol/L (ref 134–144)
Total Protein: 7.4 g/dL (ref 6.0–8.5)
eGFR: 102 mL/min/{1.73_m2} (ref >59)

## 2022-11-03 LAB — SPECIMEN STATUS REPORT

## 2022-11-03 LAB — HGB A1C W/O EAG: Hgb A1c MFr Bld: 5.6 % (ref 4.8–5.6)

## 2022-11-09 ENCOUNTER — Ambulatory Visit: Payer: Self-pay | Admitting: Gastroenterology

## 2023-04-28 ENCOUNTER — Other Ambulatory Visit: Payer: Self-pay | Admitting: Family Medicine

## 2023-04-28 DIAGNOSIS — Z1211 Encounter for screening for malignant neoplasm of colon: Secondary | ICD-10-CM

## 2023-04-28 DIAGNOSIS — Z1212 Encounter for screening for malignant neoplasm of rectum: Secondary | ICD-10-CM

## 2023-11-07 ENCOUNTER — Ambulatory Visit: Payer: Self-pay | Admitting: Family Medicine

## 2023-11-07 NOTE — Telephone Encounter (Signed)
 Copied from CRM [id number]. Topic: Clinical - Red Word Triage >> Nov 07, 2023  3:56 PM Lot[place] B wrote: [facility] that prompted transfer to Nurse Triage: really bad headache .. 10/10 pain   Chief Complaint: headache Symptoms: sinus congestion, stiff neck Frequency: constant Pertinent Negatives: Patient denies fever Disposition: [] ED /[] Urgent Care (no appt availability in office) / [] Appointment(In office/virtual)/ []  [facility]/ [] Home Care/ [] Refused Recommended Disposition /[] [facility]/ []  Follow-up with PCP Additional Notes: Reports head pressure on back of head and neck pain. States that he took Afrin spray for a couple of weeks because that was the only way he could rest. Also reports nasal congestion that has been lingering for several weeks. Appt scheduled with Dr. Monnie Anthony for 4/17  Reason for Disposition  [1] MILD-MODERATE headache AND [2] present > 72 hours  Answer Assessment - Initial Assessment Questions 1. LOCATION: "Where does it hurt?"      Back of head, radiating to the front of head  2. ONSET: "When did the headache start?" (Minutes, hours or days)      3 weeks ago  3. PATTERN: "Does the pain come and go, or has it been constant since it started?"     Comes and goes for the first few few weeks, but now constant  4. SEVERITY: "How bad is the pain?" and "What does it keep you from doing?"  (e.g., Scale 1-10; mild, moderate, or severe)   - MILD (1-3): doesn't interfere with normal activities    - MODERATE (4-7): interferes with normal activities or awakens from sleep    - SEVERE (8-10): excruciating pain, unable to do any normal activities  Moderate to severe       5. RECURRENT SYMPTOM: "Have you ever had headaches before?" If Yes, ask: "When was the last time?" and "What happened that time?"      Yes, usually associated with allergies or sinus congestion  6. CAUSE: "What do you think is causing the headache?"     Unsure  7. MIGRAINE: "Have  you been diagnosed with migraine headaches?" If Yes, ask: "Is this headache similar?"      No  8. HEAD INJURY: "Has there been any recent injury to the head?"      No  9. OTHER SYMPTOMS: "Do you have any other symptoms?" (fever, stiff neck, eye pain, sore throat, cold symptoms)     Stiff neck, sinus congestion  Protocols used: Headache-A-AH

## 2023-11-08 NOTE — Progress Notes (Unsigned)
 No chief complaint on file.  Patient presents with complaint of headache and congestion. Pain starts at the back of the head, radiates to the front.  Pain started 3 weeks ago. It was intermittent at first, but now is a constant pain. He has gotten this before, usually associated with allergies or sinus congestion, which he has also been having.      PMH, PSH, SH reviewed  He last saw Dr. Robina Chol in 10/2022 for change in bowels, weight loss. He was referred for colonoscopy, hasn't had it yet.   ROS:    PHYSICAL EXAM:  There were no vitals taken for this visit.      ASSESSMENT/PLAN:    Needs to schedule CPE with PCP Needs to schedule colonoscopy

## 2023-11-09 ENCOUNTER — Ambulatory Visit (INDEPENDENT_AMBULATORY_CARE_PROVIDER_SITE_OTHER): Payer: Self-pay | Admitting: Family Medicine

## 2023-11-09 ENCOUNTER — Encounter: Payer: Self-pay | Admitting: Family Medicine

## 2023-11-09 VITALS — BP 120/78 | HR 70 | Temp 97.9°F | Wt 121.6 lb

## 2023-11-09 DIAGNOSIS — L409 Psoriasis, unspecified: Secondary | ICD-10-CM

## 2023-11-09 DIAGNOSIS — J302 Other seasonal allergic rhinitis: Secondary | ICD-10-CM

## 2023-11-09 DIAGNOSIS — R519 Headache, unspecified: Secondary | ICD-10-CM

## 2023-11-09 NOTE — Patient Instructions (Addendum)
 Continue zyrtec daily. You have significant congestion related to allergies, based on your exam. There is no evidence of infection. The congestion is affecting your ears.  Your ears appear normal--no infection or fluid. I recommend using Flonase, 2 sprays into each nostril (using gentle sniffs only) Use this daily until the end of allergy season. It can take up to a week to see the full effect, so be patient. I recommend wearing a mask when outside doing yardwork, or use saline rinses when you come inside. If you develop a lot of postnasal drainage or cough, you can use Mucinex or Robitussin. Contact us  if you develop fever or discolored mucus.  Your neck pain may be related to muscle tension (can't rule out arthritis)--there is no indication of something more serious (no meningitis or brain issue). Try and keep your head in a neutral position (not looking down constantly).  Do frequent posture breaks and stretches. You can try heat, massage. You can continue to use anti-inflammatories such as ibuprofen, if needed.  You are due to see Dr. Robina Chol for a check-up.  You likely should also see a dermatologist for your psoriasis and the nonhealing area on the back of your right ear.
